# Patient Record
Sex: Female | Born: 1985 | Race: Black or African American | Hispanic: No | Marital: Single | Smoking: Former smoker
Health system: Southern US, Community
[De-identification: ages and names within clinical notes are randomized; demographics above are authoritative.]

## PROBLEM LIST (undated history)

## (undated) ENCOUNTER — Inpatient Hospital Stay (HOSPITAL_COMMUNITY): Payer: Self-pay

## (undated) DIAGNOSIS — B999 Unspecified infectious disease: Secondary | ICD-10-CM

## (undated) DIAGNOSIS — R87629 Unspecified abnormal cytological findings in specimens from vagina: Secondary | ICD-10-CM

## (undated) HISTORY — PX: LEEP: SHX91

---

## 2015-11-07 ENCOUNTER — Encounter: Payer: Self-pay | Admitting: General Practice

## 2015-11-07 ENCOUNTER — Ambulatory Visit (INDEPENDENT_AMBULATORY_CARE_PROVIDER_SITE_OTHER): Payer: Self-pay | Admitting: General Practice

## 2015-11-07 ENCOUNTER — Encounter: Payer: Self-pay | Admitting: Obstetrics & Gynecology

## 2015-11-07 DIAGNOSIS — Z3201 Encounter for pregnancy test, result positive: Secondary | ICD-10-CM

## 2015-11-07 LAB — POCT PREGNANCY, URINE: PREG TEST UR: POSITIVE — AB

## 2015-11-07 NOTE — Progress Notes (Signed)
Patient here today for pregnancy test. UPT positive. Patient reports first positive home pregnancy test on 11/04/15. Reports lmp 09/24/15. edd 07/01/15. 6133w2d today.

## 2015-12-24 NOTE — L&D Delivery Note (Signed)
Delivery Note At 2:17 AM a viable female was delivered via Vaginal, Spontaneous Delivery (Presentation: ; Occiput Anterior).  APGARs and weight pending, NICU team at delivery.   Placenta status: Intact, Spontaneous.  Cord: 3 vessels with the following complications:  None, placenta sent to pathology.  Anesthesia: Epidural  Episiotomy: None Lacerations: 1st degree Suture Repair: 3.0 vicryl rapide Est. Blood Loss (mL): 200  Mom to postpartum.  Baby to NICU.  Erica Salas D 05/21/2016, 2:30 AM

## 2016-02-16 DIAGNOSIS — O26879 Cervical shortening, unspecified trimester: Secondary | ICD-10-CM | POA: Diagnosis present

## 2016-02-16 NOTE — H&P (Signed)
Erica Salas is a 30 y.o. female G2P1001 at 28+6 with shortened cervical length.  On anatomy scan = 2.1, using vaginal progesterone - recheck 2 weeks = 1.5.  D/W pt shortened cervix and poss early/previable delivery.  D/W pt r/b/a of McDonald cerclage, questions answered.  Will proceed.    Maternal Medical History:  Fetal activity: Perceived fetal activity is normal.      OB History    No data available    G2P1001 G1 2007 - female, 6#7 G2 present  +abn pap - colpo, poss LEEP H/o Chl  PMH: HTN; seasonal allergies  No past surgical history on file. 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   HENT: Negative.   Eyes: Negative.   Respiratory: Negative.   Cardiovascular: Negative.   Gastrointestinal: Negative.   Genitourinary: Negative.   Musculoskeletal: Negative.   Skin: Negative.   Neurological: Negative.   Psychiatric/Behavioral: Negative.       There were no vitals taken for this visit. Maternal Exam:  Abdomen: Patient reports no abdominal tenderness. Fundal height is appropriate for gestation.    Introitus: Normal vulva. Normal vagina.    Physical Exam  Constitutional: She is oriented to person, place, and time. She appears well-developed and well-nourished.  HENT:  Head: Normocephalic and atraumatic.  Cardiovascular: Normal rate and regular rhythm.   Respiratory: Effort normal. No respiratory distress. She has no wheezes.  GI:  Soft. Bowel sounds are normal. She exhibits no distension. There is no tenderness.  Musculoskeletal: Normal range of motion.  Neurological: She is alert and oriented to person, place, and time.  Skin: Skin is warm and dry.  Psychiatric: She has a normal mood and affect. Her behavior is normal.    Prenatal labs: ABO, Rh:  O+ Antibody:  negative Rubella:  immune RPR:   NR HBsAg:   neg HIV:   neg GBS:   unknown  CF neg/Hgb 10.9/Plt 420/GC neg/Chl neg/Varicella immune/Hgb electro WNL/First Tri US WNL  [place] nl anat, post plac, female CL = 2.1 to 1.5  Assessment/Plan: 29yo G2P1001 at 18+ with shortened cervical length for McDonald cerclage, d/w pt r/b/a of procedure  Erica Salas, Erica Salas 02/16/2016, 9:47 PM

## 2016-02-17 ENCOUNTER — Encounter (HOSPITAL_COMMUNITY)
Admission: AD | Disposition: A | Discharge status: Home / Self Care | Payer: Self-pay | Source: Ambulatory Visit | Attending: Obstetrics and Gynecology

## 2016-02-17 ENCOUNTER — Inpatient Hospital Stay: Admit: 2016-02-17 | Payer: Self-pay | Admitting: Obstetrics and Gynecology

## 2016-02-17 ENCOUNTER — Inpatient Hospital Stay (HOSPITAL_COMMUNITY): Payer: Medicaid Other | Admitting: Anesthesiology

## 2016-02-17 ENCOUNTER — Inpatient Hospital Stay (HOSPITAL_COMMUNITY)
Admission: AD | Admit: 2016-02-17 | Discharge: 2016-02-17 | Disposition: A | Discharge status: Home / Self Care | Payer: Medicaid Other | Source: Ambulatory Visit | Attending: Obstetrics and Gynecology | Admitting: Obstetrics and Gynecology

## 2016-02-17 ENCOUNTER — Encounter (HOSPITAL_COMMUNITY): Payer: Self-pay | Admitting: *Deleted

## 2016-02-17 DIAGNOSIS — O26872 Cervical shortening, second trimester: Secondary | ICD-10-CM | POA: Insufficient documentation

## 2016-02-17 DIAGNOSIS — Z87891 Personal history of nicotine dependence: Secondary | ICD-10-CM | POA: Insufficient documentation

## 2016-02-17 DIAGNOSIS — Z3A20 20 weeks gestation of pregnancy: Secondary | ICD-10-CM | POA: Diagnosis not present

## 2016-02-17 HISTORY — PX: CERVICAL CERCLAGE: SHX1329

## 2016-02-17 HISTORY — DX: Unspecified infectious disease: B99.9

## 2016-02-17 HISTORY — DX: Unspecified abnormal cytological findings in specimens from vagina: R87.629

## 2016-02-17 LAB — CBC
HCT: 29.8 % — ABNORMAL LOW (ref 36.0–46.0)
Hemoglobin: 10.2 g/dL — ABNORMAL LOW (ref 12.0–15.0)
MCH: 28.9 pg (ref 26.0–34.0)
MCHC: 34.2 g/dL (ref 30.0–36.0)
MCV: 84.4 fL (ref 78.0–100.0)
PLATELETS: 326 10*3/uL (ref 150–400)
RBC: 3.53 MIL/uL — ABNORMAL LOW (ref 3.87–5.11)
RDW: 14 % (ref 11.5–15.5)
WBC: 11.2 10*3/uL — ABNORMAL HIGH (ref 4.0–10.5)

## 2016-02-17 SURGERY — CERCLAGE, CERVIX, VAGINAL APPROACH
Anesthesia: Spinal | Site: Vagina

## 2016-02-17 MED ORDER — BUPIVACAINE IN DEXTROSE 0.75-8.25 % IT SOLN
INTRATHECAL | Status: DC | PRN
  Administered 2016-02-17: 1 mL via INTRATHECAL

## 2016-02-17 MED ORDER — IBUPROFEN 600 MG PO TABS
ORAL_TABLET | ORAL | Status: AC
  Filled 2016-02-17: qty 1

## 2016-02-17 MED ORDER — IBUPROFEN 800 MG PO TABS: 800.0000 mg | ORAL_TABLET | Freq: Three times a day (TID) | ORAL | Status: AC

## 2016-02-17 MED ORDER — IBUPROFEN 600 MG PO TABS
600.0000 mg | ORAL_TABLET | Freq: Once | ORAL | Status: AC
  Administered 2016-02-17: 600 mg via ORAL

## 2016-02-17 MED ORDER — PROGESTERONE MICRONIZED 200 MG PO CAPS: 200.0000 mg | ORAL_CAPSULE | Freq: Every day | ORAL | Status: DC

## 2016-02-17 MED ORDER — LACTATED RINGERS IV SOLN
INTRAVENOUS | Status: DC
  Administered 2016-02-17 (×2): via INTRAVENOUS

## 2016-02-17 SURGICAL SUPPLY — 21 items
CANISTER SUCT 3000ML (MISCELLANEOUS) ×3 IMPLANT
CLOTH BEACON ORANGE TIMEOUT ST (SAFETY) ×3 IMPLANT
COUNTER NEEDLE 1200 MAGNETIC (NEEDLE) IMPLANT
GLOVE BIO SURGEON STRL SZ 6.5 (GLOVE) ×2 IMPLANT
GLOVE BIO SURGEONS STRL SZ 6.5 (GLOVE) ×1
GLOVE BIOGEL PI IND STRL 7.0 (GLOVE) ×1 IMPLANT
GLOVE BIOGEL PI INDICATOR 7.0 (GLOVE) ×2
GOWN STRL REUS W/TWL LRG LVL3 (GOWN DISPOSABLE) ×6 IMPLANT
NEEDLE MAYO CATGUT SZ4 (NEEDLE) ×3 IMPLANT
NS IRRIG 1000ML POUR BTL (IV SOLUTION) ×3 IMPLANT
PACK VAGINAL MINOR WOMEN LF (CUSTOM PROCEDURE TRAY) ×3 IMPLANT
PAD OB MATERNITY 4.3X12.25 (PERSONAL CARE ITEMS) ×3 IMPLANT
PAD PREP 24X48 CUFFED NSTRL (MISCELLANEOUS) ×3 IMPLANT
SUT PROLENE 1 CTX 30  8455H (SUTURE) ×4
SUT PROLENE 1 CTX 30 8455H (SUTURE) ×2 IMPLANT
TOWEL OR 17X24 6PK STRL BLUE (TOWEL DISPOSABLE) ×6 IMPLANT
TRAY FOLEY CATH SILVER 14FR (SET/KITS/TRAYS/PACK) ×3 IMPLANT
TUBING NON-CON 1/4 X 20 CONN (TUBING) ×2 IMPLANT
TUBING NON-CON 1/4 X 20' CONN (TUBING) ×1
WATER STERILE IRR 1000ML POUR (IV SOLUTION) ×3 IMPLANT
YANKAUER SUCT BULB TIP NO VENT (SUCTIONS) ×3 IMPLANT

## 2016-02-17 NOTE — Anesthesia Postprocedure Evaluation (Signed)
Anesthesia Post Note  Patient: Erica Salas  Procedure(s) Performed: Procedure(s) (LRB): CERCLAGE CERVICAL (N/A)  Patient location during evaluation: PACU Anesthesia Type: Spinal Level of consciousness: awake and alert Pain management: pain level controlled Vital Signs Assessment: post-procedure vital signs reviewed and stable Respiratory status: spontaneous breathing, nonlabored ventilation, respiratory function stable and patient connected to nasal cannula oxygen Cardiovascular status: blood pressure returned to baseline and stable Postop Assessment: no signs of nausea or vomiting and spinal receding Anesthetic complications: no    Last Vitals:  Filed Vitals:   02/17/16 1045 02/17/16 1100  BP: 125/79 124/75  Pulse: 75 82  Temp:    Resp: 18 18    Last Pain: There were no vitals filed for this visit.               Ikea Demicco JENNETTE

## 2016-02-17 NOTE — Anesthesia Preprocedure Evaluation (Signed)
Anesthesia Evaluation  Patient identified by MRN, date of birth, ID band Patient awake    Reviewed: Allergy & Precautions, NPO status , Patient's Chart, lab work & pertinent test results  History of Anesthesia Complications Negative for: history of anesthetic complications  Airway Mallampati: II  TM Distance: >3 FB Neck ROM: Full    Dental no notable dental hx. (+) Dental Advisory Given   Pulmonary former smoker,    Pulmonary exam normal breath sounds clear to auscultation       Cardiovascular negative cardio ROS Normal cardiovascular exam Rhythm:Regular Rate:Normal     Neuro/Psych negative neurological ROS  negative psych ROS   GI/Hepatic negative GI ROS, Neg liver ROS,   Endo/Other  negative endocrine ROS  Renal/GU negative Renal ROS  negative genitourinary   Musculoskeletal negative musculoskeletal ROS (+)   Abdominal   Peds negative pediatric ROS (+)  Hematology negative hematology ROS (+)   Anesthesia Other Findings   Reproductive/Obstetrics (+) Pregnancy 20 weeks                             Anesthesia Physical Anesthesia Plan  ASA: II  Anesthesia Plan: Spinal   Post-op Pain Management:    Induction:   Airway Management Planned:   Additional Equipment:   Intra-op Plan:   Post-operative Plan:   Informed Consent: I have reviewed the patients History and Physical, chart, labs and discussed the procedure including the risks, benefits and alternatives for the proposed anesthesia with the patient or authorized representative who has indicated his/her understanding and acceptance.   Dental advisory given  Plan Discussed with: CRNA  Anesthesia Plan Comments:         Anesthesia Quick Evaluation

## 2016-02-17 NOTE — Transfer of Care (Signed)
Immediate Anesthesia Transfer of Care Note  Patient: Erica Salas  Procedure(s) Performed: Procedure(s): CERCLAGE CERVICAL (N/A)  Patient Location: PACU  Anesthesia Type:Spinal  Level of Consciousness: awake, alert  and oriented  Airway & Oxygen Therapy: Patient Spontanous Breathing  Post-op Assessment: Report given to RN and Post -op Vital signs reviewed and stable  Post vital signs: Reviewed and stable  Last Vitals:  Filed Vitals:   02/17/16 0747  BP: 122/70  Pulse: 92  Temp: 36.8 C  Resp: 16    Complications: No apparent anesthesia complications

## 2016-02-17 NOTE — H&P (View-Only) (Signed)
Erica Salas is a 29 y.o. female G2P1001 at 20+6 with shortened cervical length.  On anatomy scan = 2.1, using vaginal progesterone - recheck 2 weeks = 1.5.  D/W pt shortened cervix and poss early/previable delivery.  D/W pt r/b/a of McDonald cerclage, questions answered.  Will proceed.    Maternal Medical History:  Fetal activity: Perceived fetal activity is normal.      OB History    No data available    G2P1001 G1 2007 - female, 6#7 G2 present  +abn pap - colpo, poss LEEP H/o Chl  PMH: HTN; seasonal allergies  No past surgical history on file. 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   HENT: Negative.   Eyes: Negative.   Respiratory: Negative.   Cardiovascular: Negative.   Gastrointestinal: Negative.   Genitourinary: Negative.   Musculoskeletal: Negative.   Skin: Negative.   Neurological: Negative.   Psychiatric/Behavioral: Negative.       There were no vitals taken for this visit. Maternal Exam:  Abdomen: Patient reports no abdominal tenderness. Fundal height is appropriate for gestation.    Introitus: Normal vulva. Normal vagina.    Physical Exam  Constitutional: She is oriented to person, place, and time. She appears well-developed and well-nourished.  HENT:  Head: Normocephalic and atraumatic.  Cardiovascular: Normal rate and regular rhythm.   Respiratory: Effort normal. No respiratory distress. She has no wheezes.  GI:  Soft. Bowel sounds are normal. She exhibits no distension. There is no tenderness.  Musculoskeletal: Normal range of motion.  Neurological: She is alert and oriented to person, place, and time.  Skin: Skin is warm and dry.  Psychiatric: She has a normal mood and affect. Her behavior is normal.    Prenatal labs: ABO, Rh:  O+ Antibody:  negative Rubella:  immune RPR:   NR HBsAg:   neg HIV:   neg GBS:   unknown  CF neg/Hgb 10.9/Plt 420/GC neg/Chl neg/Varicella immune/Hgb electro WNL/First Tri US WNL  US nl anat, post plac, female CL = 2.1 to 1.5  Assessment/Plan: 29yo G2P1001 at 20+ with shortened cervical length for McDonald cerclage, d/w pt r/b/a of procedure  Bovard-Stuckert, Thaniel Coluccio 02/16/2016, 9:47 PM     

## 2016-02-17 NOTE — Interval H&P Note (Signed)
History and Physical Interval Note:  02/17/2016 8:25 AM  Erica Salas  has presented today for surgery, with the diagnosis of short cervix  The various methods of treatment have been discussed with the patient and family. After consideration of risks, benefits and other options for treatment, the patient has consented to  Procedure(s): CERCLAGE CERVICAL (N/A) as a surgical intervention .  The patient's history has been reviewed, patient examined, no change in status, stable for surgery.  I have reviewed the patient's chart and labs.  Questions were answered to the patient's satisfaction.  Pt denies cramping or bleeding.     Bovard-Stuckert, Takeia Ciaravino

## 2016-02-17 NOTE — Anesthesia Procedure Notes (Signed)
Spinal Patient location during procedure: OR Staffing Anesthesiologist: Jazzman Loughmiller Performed by: anesthesiologist  Preanesthetic Checklist Completed: patient identified, site marked, surgical consent, pre-op evaluation, timeout performed, IV checked, risks and benefits discussed and monitors and equipment checked Spinal Block Patient position: sitting Prep: ChloraPrep Patient monitoring: continuous pulse ox, blood pressure and heart rate Approach: midline Injection technique: single-shot Needle Needle type: Sprotte  Needle gauge: 24 G Needle length: 9 cm Additional Notes Functioning IV was confirmed and monitors were applied. Sterile prep and drape, including hand hygiene, mask and sterile gloves were used. The patient was positioned and the spine was prepped. The skin was anesthetized with lidocaine.  Free flow of clear CSF was obtained prior to injecting local anesthetic into the CSF.  The spinal needle aspirated freely following injection.  The needle was carefully withdrawn.  The patient tolerated the procedure well. Consent was obtained prior to procedure with all questions answered and concerns addressed. Risks including but not limited to bleeding, infection, nerve damage, paralysis, failed block, inadequate analgesia, allergic reaction, high spinal, itching and headache were discussed and the patient wished to proceed.   Cecilie Heidel, MD     

## 2016-02-17 NOTE — Op Note (Signed)
Erica Salas, GABLE               ACCOUNT NO.:  [id number]  MEDICAL RECORD NO.:  [id number]  LOCATION:  [facility]                          FACILITY:  [facility]  PHYSICIAN:  Sherron Monday, MD        DATE OF BIRTH:  08-02-1986  DATE OF PROCEDURE:  02/17/2016 DATE OF DISCHARGE:                              OPERATIVE REPORT   PREOPERATIVE DIAGNOSIS:  Intrauterine pregnancy at 20+ 5 weeks.  Normal anatomy with shortened cervix from 2 cm to 1.5 cm when followed on ultrasound.  POSTOPERATIVE DIAGNOSIS:  Intrauterine pregnancy at 20+ 5 weeks.  Normal anatomy with shortened cervix from 2 cm to 1.5 cm when followed on ultrasound.  PROCEDURE:  McDonald cervical cerclage with 2 knots at 12 o'clock.  SURGEON:  Sherron Monday, MD.  ANESTHESIA:  Spinal.  IV FLUIDS:  1100 mL.  URINE OUTPUT:  150 mL clear urine at the end of procedure.  ESTIMATED BLOOD LOSS:  Approximately 5 mL.  COMPLICATIONS:  None.  PATHOLOGY:  None.  DESCRIPTION OF PROCEDURE:  After informed consent was reviewed with the patient and the father of the baby, she was transported to the OR, where spinal anesthesia was placed and she was returned to supine position. When this was found to be adequate, she was prepped and draped in the normal sterile fashion.  Her feet were placed in Yellofin stirrups and a Foley catheter was sterilely placed in her bladder.  Using an open-sided Graves speculum, her cervix was visualized and noted to be short, although on digital exam, it was felt to be almost 2 cm long as she had had a LEEP procedure.  Her cervix was difficult to differentiate from her vagina.  It was grasped with sponge sticks and these were used to help delineate the cervix.  Two sutures of 0 Monocryl were placed with bites from 12 to 10, 10 to 7, 7 to 4, 4 to 2 and 2 back to 12, these were both knotted.  They were placed at approximately the same level.  The initial suture was difficult to grasp the posterior lip and left side of  the cervix.  This was with a second suture was thought to be better.  The cervix was better held with the sutures.  At the end of the procedure, the cervix was closed and felt to be 1 cm long, and there was minimal bleeding.  The patient tolerated the procedure well. Sponge, lap, and needle count was correct x2 per the operating room staff.     Sherron Monday, MD     JB/MEDQ  D:  02/17/2016  T:  02/17/2016  Job:  [id number]

## 2016-02-17 NOTE — Brief Op Note (Signed)
02/17/2016  10:17 AM  PATIENT:  Erica Salas  30 y.o. female  PRE-OPERATIVE DIAGNOSIS:  short cervix  POST-OPERATIVE DIAGNOSIS:  short cervix  PROCEDURE:  Procedure(s): CERCLAGE CERVICAL (N/A)  SURGEON:  Surgeon(s) and Role:    * Timberlee Roblero Bovard-Stuckert, MD - Primary   ANESTHESIA:   spinal  EBL:  Total I/O In: 1100 [I.V.:1100] Out: 155 [Urine:150; Blood:5]  BLOOD ADMINISTERED:none  DRAINS: none   LOCAL MEDICATIONS USED:  NONE  SPECIMEN:  No Specimen  DISPOSITION OF SPECIMEN:  N/A  COUNTS:  YES  TOURNIQUET:  * No tourniquets in log *  DICTATION: .Other Dictation: Dictation Number [id number]  PLAN OF CARE: Discharge to home after PACU  PATIENT DISPOSITION:  PACU - hemodynamically stable.   Delay start of Pharmacological VTE agent (>24hrs) due to surgical blood loss or risk of bleeding: not applicable

## 2016-02-19 ENCOUNTER — Encounter (HOSPITAL_COMMUNITY): Payer: Self-pay | Admitting: Obstetrics and Gynecology

## 2016-03-03 ENCOUNTER — Inpatient Hospital Stay (HOSPITAL_COMMUNITY)
Admission: AD | Admit: 2016-03-03 | Discharge: 2016-03-03 | Disposition: A | Discharge status: Home / Self Care | Payer: Medicaid Other | Source: Ambulatory Visit | Attending: Obstetrics and Gynecology | Admitting: Obstetrics and Gynecology

## 2016-03-03 ENCOUNTER — Encounter (HOSPITAL_COMMUNITY): Payer: Self-pay

## 2016-03-03 DIAGNOSIS — O26892 Other specified pregnancy related conditions, second trimester: Secondary | ICD-10-CM | POA: Insufficient documentation

## 2016-03-03 DIAGNOSIS — Z87891 Personal history of nicotine dependence: Secondary | ICD-10-CM | POA: Insufficient documentation

## 2016-03-03 DIAGNOSIS — K921 Melena: Secondary | ICD-10-CM | POA: Diagnosis not present

## 2016-03-03 DIAGNOSIS — O99612 Diseases of the digestive system complicating pregnancy, second trimester: Secondary | ICD-10-CM | POA: Diagnosis not present

## 2016-03-03 DIAGNOSIS — Z3A23 23 weeks gestation of pregnancy: Secondary | ICD-10-CM | POA: Insufficient documentation

## 2016-03-03 DIAGNOSIS — O9989 Other specified diseases and conditions complicating pregnancy, childbirth and the puerperium: Secondary | ICD-10-CM

## 2016-03-03 DIAGNOSIS — O99891 Other specified diseases and conditions complicating pregnancy: Secondary | ICD-10-CM

## 2016-03-03 LAB — URINALYSIS, ROUTINE W REFLEX MICROSCOPIC
Bilirubin Urine: NEGATIVE
GLUCOSE, UA: NEGATIVE mg/dL
Ketones, ur: NEGATIVE mg/dL
Nitrite: NEGATIVE
PH: 6 (ref 5.0–8.0)
PROTEIN: NEGATIVE mg/dL
Specific Gravity, Urine: 1.02 (ref 1.005–1.030)

## 2016-03-03 LAB — CBC
HEMATOCRIT: 31.1 % — AB (ref 36.0–46.0)
HEMOGLOBIN: 10.5 g/dL — AB (ref 12.0–15.0)
MCH: 28.9 pg (ref 26.0–34.0)
MCHC: 33.8 g/dL (ref 30.0–36.0)
MCV: 85.7 fL (ref 78.0–100.0)
Platelets: 311 10*3/uL (ref 150–400)
RBC: 3.63 MIL/uL — ABNORMAL LOW (ref 3.87–5.11)
RDW: 14.3 % (ref 11.5–15.5)
WBC: 12.3 10*3/uL — ABNORMAL HIGH (ref 4.0–10.5)

## 2016-03-03 LAB — URINE MICROSCOPIC-ADD ON

## 2016-03-03 NOTE — Discharge Instructions (Signed)
Anal Fissure, Adult °An anal fissure is a small tear or crack in the skin around the anus. Bleeding from a fissure usually stops on its own within a few minutes. However, bleeding will often occur again with each bowel movement until the crack heals. °CAUSES °This condition may be caused by: °· Passing large, hard stool (feces). °· Frequent diarrhea. °· Constipation. °· Inflammatory bowel disease (Crohn disease or ulcerative colitis). °· Infections. °· Anal sex. °SYMPTOMS °Symptoms of this condition include: °· Bleeding from the rectum. °· Small amounts of blood seen on your stool, on toilet paper, or in the toilet after a bowel movement. °· Painful bowel movements. °· Itching or irritation around the anus. °DIAGNOSIS  °A health care provider may diagnose this condition by closely examining the anal area. An anal fissure can usually be seen with careful inspection. In some cases, a rectal exam may be performed, or a short tube (anoscope) may be used to examine the anal canal. °TREATMENT °Treatment for this condition may include: °· Taking steps to avoid constipation. This may include making changes to your diet, such as increasing your intake of fiber or fluid. °· Taking fiber supplements. These supplements can soften your stool to help make bowel movements easier. Your health care provider may also prescribe a stool softener if your stool is often hard. °· Taking sitz baths. This may help to heal the tear. °· Using medicated creams or ointments. These may be prescribed to lessen discomfort. °HOME CARE INSTRUCTIONS °Eating and Drinking °· Avoid foods that may be constipating, such as bananas and dairy products. °· Drink enough fluid to keep your urine clear or pale yellow. °· Maintain a diet that is high in fruits, whole grains, and vegetables. °General Instructions °· Keep the anal area as clean and dry as possible. °· Take sitz baths as told by your health care provider. Do not use soap in the sitz baths. °· Take  over-the-counter and prescription medicines only as told by your health care provider. °· Use creams or ointments only as told by your health care provider. °· Keep all follow-up visits as told by your health care provider. This is important. °SEEK MEDICAL CARE IF: °· You have more bleeding. °· You have a fever. °· You have diarrhea that is mixed with blood. °· You continue to have pain. °· Your problem is getting worse rather than better. °  °This information is not intended to replace advice given to you by your health care provider. Make sure you discuss any questions you have with your health care provider. °  °Document Released: 12/09/2005 Document Revised: 08/30/2015 Document Reviewed: 03/06/2015 °Elsevier Interactive Patient Education ©2016 [employer] ° °

## 2016-03-03 NOTE — MAU Provider Note (Signed)
History     CSN: [id number]  Arrival date and time: 03/03/16 [id number]   First Provider Initiated Contact with Patient 03/03/16 [phone]         Chief Complaint  Patient presents with  . Blood In Stools   HPI Comments: Erica Salas is a 30 y.o. G2P1002 at 5514w0d who presents for rectal bleeding. Had BM (passed soft stool, no straining) at 6 am this morning. Noticed bright red blood on toilet paper & bright red blood in stool. Denies abdominal or rectal pain, denies hemorrhoids. Does not think any of the blood was vaginal.   Rectal Bleeding  The current episode started today. The problem has been resolved. The patient is experiencing no pain. The stool is described as soft. Pertinent negatives include no fever, no abdominal pain, no diarrhea, no hemorrhoids, no rectal pain, no vomiting, no hematuria and no vaginal bleeding. She has been behaving normally. She has been eating and drinking normally. Her past medical history does not include abdominal surgery, inflammatory bowel disease, recent abdominal injury, recent change in diet or a recent illness. There were no sick contacts.    OB History    Gravida Para Term Preterm AB TAB SAB Ectopic Multiple Living   2 1 1  0 0 0 0 0 0 2      Past Medical History  Diagnosis Date  . Vaginal Pap smear, abnormal   . Infection     UTI    Past Surgical History  Procedure Laterality Date  . Leep    . Cervical cerclage N/A 02/17/2016    Procedure: CERCLAGE CERVICAL;  Surgeon: [name], MD;  Location: [facility];  Service: Gynecology;  Laterality: N/A;    Family History  Problem Relation Age of Onset  . Diabetes Mother   . Heart disease Mother     hx of MI  . Kidney disease Father   . Cancer Maternal Aunt     one died w/colon/liver; one died from lung    Social History  Substance Use Topics  . Smoking status: Former Smoker    Types: Cigarettes  . Smokeless tobacco: Never Used     Comment: quit Oct 2016  . Alcohol Use: No     Allergies: No Known Allergies  Prescriptions prior to admission  Medication Sig Dispense Refill Last Dose  . Prenatal Vit-Fe Fumarate-FA (PRENATAL MULTIVITAMIN) TABS tablet Take 1 tablet by mouth daily at 12 noon.   02/17/2016 at Unknown time  . progesterone (PROMETRIUM) 200 MG capsule Place 1 capsule (200 mg total) vaginally at bedtime. 30 capsule 5     Review of Systems  Constitutional: Negative.  Negative for fever.  Gastrointestinal: Positive for blood in stool and hematochezia. Negative for vomiting, abdominal pain, diarrhea, melena, rectal pain and hemorrhoids.  Genitourinary: Negative.  Negative for hematuria and vaginal bleeding.   Physical Exam   Blood pressure 139/79, pulse 93, temperature 98 F (36.7 C), resp. rate 18, height 5' 3.5" (1.613 m), weight 170 lb 9.6 oz (77.384 kg).  Physical Exam  Nursing note and vitals reviewed. Constitutional: She is oriented to person, place, and time. She appears well-developed and well-nourished. No distress.  HENT:  Head: Normocephalic and atraumatic.  Eyes: Conjunctivae are normal. Right eye exhibits no discharge. Left eye exhibits no discharge. No scleral icterus.  Neck: Normal range of motion.  Cardiovascular: Normal rate.   Respiratory: Effort normal. No respiratory distress.  GI: Soft. Bowel sounds are normal. There is no tenderness.  Genitourinary: Vagina normal.  Rectal exam shows fissure. Rectal exam shows no external hemorrhoid and no tenderness. No bleeding in the vagina.  Cervix visually closed. Cerclage visualized.  Miniscule amount of blood on digital rectal exam.   Neurological: She is alert and oriented to person, place, and time.  Skin: Skin is warm and dry. She is not diaphoretic.  Psychiatric: She has a normal mood and affect. Her behavior is normal. Judgment and thought content normal.    MAU Course  Procedures  MDM CBC FHT appropriate for gestation, no contractions Cervix visually closed & no blood noted  during SSE Small anal fissure. Minimal amount of blood on DRE  Care turned over to [facility]. CBC pending.     Judeth Horn, NP 03/03/2016 8:05 AM   Care of pt assumed by Dorathy Kinsman, CNM,   CBC Latest Ref Rng 03/03/2016 02/17/2016  WBC 4.0 - 10.5 K/uL 12.3(H) 11.2(H)  Hemoglobin 12.0 - 15.0 g/dL 10.5(L) 10.2(L)  Hematocrit 36.0 - 46.0 % 31.1(L) 29.8(L)  Platelets 150 - 400 K/uL 311 326   Hgb stable. Doubt GI bleed.   Assessment and Plan   1. Hematochezia   2. Current maternal conditions classifiable elsewhere, antepartum    Plan:  D/C home in stable condition per consult w/ Dr. Jackelyn Knife. Increase fluids and fiber. PLT precautions.  Follow-up Information    Follow up with [facility].   Why:  Routine prenatal visit or sooner as needed if symptoms worsen   Contact information:   [address]  [address] [place] [address] [phone]       Follow up with [facility] [place] [facility] AD[facility].   Why:  As needed in emergencies   Contact information:   [address] [id number] mc [place] [address] [phone]        Medication List    ASK your doctor about these medications        metroNIDAZOLE 250 MG tablet  Commonly known as:  FLAGYL  Take 250 mg by mouth 2 (two) times daily.     prenatal multivitamin Tabs tablet  Take 1 tablet by mouth daily at 12 noon.     progesterone 200 MG capsule  Commonly known as:  PROMETRIUM  Place 1 capsule (200 mg total) vaginally at bedtime.        [place]ier, CNM 03/03/2016 9:14 AM

## 2016-03-03 NOTE — MAU Note (Addendum)
I had BM about 0615 and it had a lot of blood in it. No pain. No hx hemorrhoids. I am sure it was not vaginal bleeding. Has cerclage

## 2016-03-14 ENCOUNTER — Encounter (HOSPITAL_COMMUNITY): Payer: Medicaid Other

## 2016-03-14 ENCOUNTER — Ambulatory Visit (HOSPITAL_COMMUNITY): Payer: Medicaid Other

## 2016-03-20 ENCOUNTER — Other Ambulatory Visit (HOSPITAL_COMMUNITY): Payer: Self-pay | Admitting: Obstetrics and Gynecology

## 2016-03-20 DIAGNOSIS — O359XX0 Maternal care for (suspected) fetal abnormality and damage, unspecified, not applicable or unspecified: Secondary | ICD-10-CM

## 2016-03-21 ENCOUNTER — Encounter (HOSPITAL_COMMUNITY): Payer: Self-pay

## 2016-03-21 ENCOUNTER — Ambulatory Visit (HOSPITAL_COMMUNITY): Admission: RE | Admit: 2016-03-21 | Payer: Medicaid Other | Source: Ambulatory Visit

## 2016-03-21 ENCOUNTER — Other Ambulatory Visit (HOSPITAL_COMMUNITY): Payer: Self-pay | Admitting: Obstetrics and Gynecology

## 2016-03-21 ENCOUNTER — Ambulatory Visit (HOSPITAL_COMMUNITY)
Admission: RE | Admit: 2016-03-21 | Discharge: 2016-03-21 | Disposition: A | Discharge status: Home / Self Care | Payer: Medicaid Other | Source: Ambulatory Visit | Attending: Obstetrics and Gynecology | Admitting: Obstetrics and Gynecology

## 2016-03-21 DIAGNOSIS — Z3A25 25 weeks gestation of pregnancy: Secondary | ICD-10-CM | POA: Diagnosis not present

## 2016-03-21 DIAGNOSIS — O3432 Maternal care for cervical incompetence, second trimester: Secondary | ICD-10-CM | POA: Insufficient documentation

## 2016-03-21 DIAGNOSIS — Z36 Encounter for antenatal screening of mother: Secondary | ICD-10-CM | POA: Insufficient documentation

## 2016-03-21 DIAGNOSIS — O162 Unspecified maternal hypertension, second trimester: Secondary | ICD-10-CM | POA: Diagnosis not present

## 2016-03-21 DIAGNOSIS — O10912 Unspecified pre-existing hypertension complicating pregnancy, second trimester: Secondary | ICD-10-CM

## 2016-03-21 DIAGNOSIS — O283 Abnormal ultrasonic finding on antenatal screening of mother: Secondary | ICD-10-CM

## 2016-03-21 DIAGNOSIS — O359XX0 Maternal care for (suspected) fetal abnormality and damage, unspecified, not applicable or unspecified: Secondary | ICD-10-CM

## 2016-03-22 ENCOUNTER — Other Ambulatory Visit (HOSPITAL_COMMUNITY): Payer: Self-pay | Admitting: *Deleted

## 2016-03-22 DIAGNOSIS — IMO0001 Reserved for inherently not codable concepts without codable children: Secondary | ICD-10-CM

## 2016-03-22 DIAGNOSIS — O358XX0 Maternal care for other (suspected) fetal abnormality and damage, not applicable or unspecified: Principal | ICD-10-CM

## 2016-03-27 ENCOUNTER — Encounter (HOSPITAL_COMMUNITY): Payer: Self-pay

## 2016-03-27 ENCOUNTER — Other Ambulatory Visit (HOSPITAL_COMMUNITY): Payer: Self-pay

## 2016-04-18 ENCOUNTER — Encounter (HOSPITAL_COMMUNITY): Payer: Self-pay | Admitting: *Deleted

## 2016-04-18 ENCOUNTER — Observation Stay (HOSPITAL_COMMUNITY)
Admission: AD | Admit: 2016-04-18 | Discharge: 2016-04-19 | Disposition: A | Discharge status: Home / Self Care | Payer: Medicaid Other | Source: Ambulatory Visit | Attending: Obstetrics and Gynecology | Admitting: Obstetrics and Gynecology

## 2016-04-18 DIAGNOSIS — O9A213 Injury, poisoning and certain other consequences of external causes complicating pregnancy, third trimester: Secondary | ICD-10-CM | POA: Diagnosis not present

## 2016-04-18 DIAGNOSIS — S3991XA Unspecified injury of abdomen, initial encounter: Secondary | ICD-10-CM | POA: Diagnosis not present

## 2016-04-18 DIAGNOSIS — Y9389 Activity, other specified: Secondary | ICD-10-CM | POA: Diagnosis not present

## 2016-04-18 DIAGNOSIS — Y9289 Other specified places as the place of occurrence of the external cause: Secondary | ICD-10-CM | POA: Diagnosis not present

## 2016-04-18 DIAGNOSIS — Y990 Civilian activity done for income or pay: Secondary | ICD-10-CM | POA: Insufficient documentation

## 2016-04-18 DIAGNOSIS — O9A219 Injury, poisoning and certain other consequences of external causes complicating pregnancy, unspecified trimester: Secondary | ICD-10-CM

## 2016-04-18 DIAGNOSIS — Z87440 Personal history of urinary (tract) infections: Secondary | ICD-10-CM | POA: Insufficient documentation

## 2016-04-18 DIAGNOSIS — W51XXXA Accidental striking against or bumped into by another person, initial encounter: Secondary | ICD-10-CM

## 2016-04-18 DIAGNOSIS — Z3A29 29 weeks gestation of pregnancy: Secondary | ICD-10-CM

## 2016-04-18 DIAGNOSIS — Z87891 Personal history of nicotine dependence: Secondary | ICD-10-CM | POA: Insufficient documentation

## 2016-04-18 LAB — OB RESULTS CONSOLE HIV ANTIBODY (ROUTINE TESTING): HIV: NONREACTIVE

## 2016-04-18 LAB — ABO/RH: ABO/RH(D): O POS

## 2016-04-18 LAB — OB RESULTS CONSOLE RPR: RPR: NONREACTIVE

## 2016-04-18 LAB — CBC
HCT: 31.2 % — ABNORMAL LOW (ref 36.0–46.0)
HEMOGLOBIN: 10.7 g/dL — AB (ref 12.0–15.0)
MCH: 29 pg (ref 26.0–34.0)
MCHC: 34.3 g/dL (ref 30.0–36.0)
MCV: 84.6 fL (ref 78.0–100.0)
Platelets: 316 10*3/uL (ref 150–400)
RBC: 3.69 MIL/uL — ABNORMAL LOW (ref 3.87–5.11)
RDW: 13.8 % (ref 11.5–15.5)
WBC: 15.3 10*3/uL — ABNORMAL HIGH (ref 4.0–10.5)

## 2016-04-18 LAB — TYPE AND SCREEN
ABO/RH(D): O POS
ANTIBODY SCREEN: NEGATIVE

## 2016-04-18 LAB — OB RESULTS CONSOLE HEPATITIS B SURFACE ANTIGEN: Hepatitis B Surface Ag: NEGATIVE

## 2016-04-18 LAB — OB RESULTS CONSOLE RUBELLA ANTIBODY, IGM: RUBELLA: IMMUNE

## 2016-04-18 MED ORDER — DOCUSATE SODIUM 100 MG PO CAPS: 100.0000 mg | ORAL_CAPSULE | Freq: Every day | ORAL | Status: DC

## 2016-04-18 MED ORDER — ZOLPIDEM TARTRATE 5 MG PO TABS: 5.0000 mg | ORAL_TABLET | Freq: Every evening | ORAL | Status: DC | PRN

## 2016-04-18 MED ORDER — ACETAMINOPHEN 325 MG PO TABS
650.0000 mg | ORAL_TABLET | ORAL | Status: DC | PRN
  Administered 2016-04-19: 650 mg via ORAL
  Filled 2016-04-18: qty 2

## 2016-04-18 MED ORDER — CALCIUM CARBONATE ANTACID 500 MG PO CHEW: 2.0000 | CHEWABLE_TABLET | ORAL | Status: DC | PRN

## 2016-04-18 MED ORDER — PRENATAL MULTIVITAMIN CH: 1.0000 | ORAL_TABLET | Freq: Every day | ORAL | Status: DC

## 2016-04-18 MED ORDER — LACTATED RINGERS IV SOLN
INTRAVENOUS | Status: DC
  Administered 2016-04-18 (×2): via INTRAVENOUS

## 2016-04-18 MED ORDER — PRENATAL MULTIVITAMIN CH
1.0000 | ORAL_TABLET | Freq: Every day | ORAL | Status: DC
  Filled 2016-04-18: qty 1

## 2016-04-18 MED ORDER — ACETAMINOPHEN 325 MG PO TABS: 650.0000 mg | ORAL_TABLET | ORAL | Status: DC | PRN
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
## 2016-04-18 NOTE — MAU Note (Signed)
Urine sent to lab 
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
## 2016-04-19 NOTE — Discharge Instructions (Signed)
Preterm Labor Information °Preterm labor is when labor starts before you are [redacted] weeks pregnant. The normal length of pregnancy is 39 to 41 weeks.  °CAUSES  °The cause of preterm labor is not often known. The most common known cause is infection. °RISK FACTORS °· Having a history of preterm labor. °· Having your water break before it should. °· Having a placenta that covers the opening of the cervix. °· Having a placenta that breaks away from the uterus. °· Having a cervix that is too weak to hold the baby in the uterus. °· Having too much fluid in the amniotic sac. °· Taking drugs or smoking while pregnant. °· Not gaining enough weight while pregnant. °· Being younger than 18 and older than 29 years old. °· Having a low income. °· Being African American. °SYMPTOMS °· Period-like cramps, belly (abdominal) pain, or back pain. °· Contractions that are regular, as often as six in an hour. They may be mild or painful. °· Contractions that start at the top of the belly. They then move to the lower belly and back. °· Lower belly pressure that seems to get stronger. °· Bleeding from the vagina. °· Fluid leaking from the vagina. °TREATMENT  °Treatment depends on: °· Your condition. °· The condition of your baby. °· How many weeks pregnant you are. °Your doctor may have you: °· Take medicine to stop contractions. °· Stay in bed except to use the restroom (bed rest). °· Stay in the hospital. °WHAT SHOULD YOU DO IF YOU THINK YOU ARE IN PRETERM LABOR? °Call your doctor right away. You need to go to the hospital right away.  °HOW CAN YOU PREVENT PRETERM LABOR IN FUTURE PREGNANCIES? °· Stop smoking, if you smoke. °· Maintain healthy weight gain. °· Do not take drugs or be around chemicals that are not needed. °· Tell your doctor if you think you have an infection. °· Tell your doctor if you had a preterm labor before. °  °This information is not intended to replace advice given to you by your health care provider. Make sure you  discuss any questions you have with your health care provider. °  °Document Released: 03/07/2009 Document Revised: 04/25/2015 Document Reviewed: 01/11/2013 °Elsevier Interactive Patient Education ©2016 Elsevier Inc. ° °
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
## 2016-04-19 NOTE — Progress Notes (Signed)
Patient ready for discharge.  She was given tylenol for a headache, the cafeteria did not bring her breakfast.  Instructions given to patient.  She will follow up with her doctor as scheduled.
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
## 2016-05-02 ENCOUNTER — Encounter (HOSPITAL_COMMUNITY): Payer: Self-pay

## 2016-05-02 ENCOUNTER — Ambulatory Visit (HOSPITAL_COMMUNITY)
Admission: RE | Admit: 2016-05-02 | Discharge: 2016-05-02 | Disposition: A | Discharge status: Home / Self Care | Payer: Medicaid Other | Source: Ambulatory Visit | Attending: Obstetrics and Gynecology | Admitting: Obstetrics and Gynecology

## 2016-05-02 DIAGNOSIS — O358XX0 Maternal care for other (suspected) fetal abnormality and damage, not applicable or unspecified: Secondary | ICD-10-CM | POA: Diagnosis present

## 2016-05-02 DIAGNOSIS — Z3A31 31 weeks gestation of pregnancy: Secondary | ICD-10-CM | POA: Diagnosis not present

## 2016-05-02 DIAGNOSIS — IMO0001 Reserved for inherently not codable concepts without codable children: Secondary | ICD-10-CM

## 2016-05-06 ENCOUNTER — Encounter (HOSPITAL_COMMUNITY): Payer: Self-pay

## 2016-05-06 ENCOUNTER — Inpatient Hospital Stay (HOSPITAL_COMMUNITY)
Admission: AD | Admit: 2016-05-06 | Discharge: 2016-05-23 | DRG: 775 | Disposition: A | Discharge status: Home / Self Care | Payer: Medicaid Other | Source: Ambulatory Visit | Attending: Obstetrics and Gynecology | Admitting: Obstetrics and Gynecology

## 2016-05-06 DIAGNOSIS — Z87891 Personal history of nicotine dependence: Secondary | ICD-10-CM

## 2016-05-06 DIAGNOSIS — O42113 Preterm premature rupture of membranes, onset of labor more than 24 hours following rupture, third trimester: Principal | ICD-10-CM | POA: Diagnosis present

## 2016-05-06 DIAGNOSIS — O26873 Cervical shortening, third trimester: Secondary | ICD-10-CM | POA: Diagnosis present

## 2016-05-06 DIAGNOSIS — Z3A34 34 weeks gestation of pregnancy: Secondary | ICD-10-CM

## 2016-05-06 DIAGNOSIS — O09219 Supervision of pregnancy with history of pre-term labor, unspecified trimester: Secondary | ICD-10-CM

## 2016-05-06 DIAGNOSIS — O42913 Preterm premature rupture of membranes, unspecified as to length of time between rupture and onset of labor, third trimester: Secondary | ICD-10-CM | POA: Diagnosis present

## 2016-05-06 DIAGNOSIS — O3433 Maternal care for cervical incompetence, third trimester: Secondary | ICD-10-CM | POA: Diagnosis present

## 2016-05-06 DIAGNOSIS — O368930 Maternal care for other specified fetal problems, third trimester, not applicable or unspecified: Secondary | ICD-10-CM | POA: Diagnosis present

## 2016-05-06 LAB — OB RESULTS CONSOLE GBS: GBS: NEGATIVE

## 2016-05-06 LAB — URINE MICROSCOPIC-ADD ON

## 2016-05-06 LAB — URINALYSIS, ROUTINE W REFLEX MICROSCOPIC
BILIRUBIN URINE: NEGATIVE
Glucose, UA: 250 mg/dL — AB
KETONES UR: NEGATIVE mg/dL
NITRITE: NEGATIVE
PH: 6.5 (ref 5.0–8.0)
Protein, ur: 30 mg/dL — AB
SPECIFIC GRAVITY, URINE: 1.02 (ref 1.005–1.030)

## 2016-05-06 LAB — TYPE AND SCREEN
ABO/RH(D): O POS
ANTIBODY SCREEN: NEGATIVE

## 2016-05-06 LAB — CBC
HCT: 30.4 % — ABNORMAL LOW (ref 36.0–46.0)
HEMOGLOBIN: 10.2 g/dL — AB (ref 12.0–15.0)
MCH: 28.6 pg (ref 26.0–34.0)
MCHC: 33.6 g/dL (ref 30.0–36.0)
MCV: 85.2 fL (ref 78.0–100.0)
PLATELETS: 338 10*3/uL (ref 150–400)
RBC: 3.57 MIL/uL — AB (ref 3.87–5.11)
RDW: 13.8 % (ref 11.5–15.5)
WBC: 14.5 10*3/uL — AB (ref 4.0–10.5)

## 2016-05-06 MED ORDER — LACTATED RINGERS IV SOLN
INTRAVENOUS | Status: DC
  Administered 2016-05-06 – 2016-05-21 (×7): via INTRAVENOUS

## 2016-05-06 MED ORDER — ACETAMINOPHEN 325 MG PO TABS
650.0000 mg | ORAL_TABLET | ORAL | Status: DC | PRN
  Administered 2016-05-08 – 2016-05-20 (×5): 650 mg via ORAL
  Filled 2016-05-06 (×4): qty 2

## 2016-05-06 MED ORDER — BETAMETHASONE SOD PHOS & ACET 6 (3-3) MG/ML IJ SUSP
12.0000 mg | INTRAMUSCULAR | Status: AC
  Administered 2016-05-06 – 2016-05-07 (×2): 12 mg via INTRAMUSCULAR
  Filled 2016-05-06 (×2): qty 2

## 2016-05-06 MED ORDER — DOCUSATE SODIUM 100 MG PO CAPS
100.0000 mg | ORAL_CAPSULE | Freq: Every day | ORAL | Status: DC
  Administered 2016-05-07 – 2016-05-20 (×14): 100 mg via ORAL
  Filled 2016-05-06 (×15): qty 1

## 2016-05-06 MED ORDER — PROGESTERONE MICRONIZED 200 MG PO CAPS
200.0000 mg | ORAL_CAPSULE | Freq: Every day | ORAL | Status: DC
  Administered 2016-05-06: 200 mg via VAGINAL
  Filled 2016-05-06: qty 1

## 2016-05-06 MED ORDER — ZOLPIDEM TARTRATE 5 MG PO TABS: 5.0000 mg | ORAL_TABLET | Freq: Every evening | ORAL | Status: DC | PRN

## 2016-05-06 MED ORDER — PRENATAL MULTIVITAMIN CH
1.0000 | ORAL_TABLET | Freq: Every day | ORAL | Status: DC
  Administered 2016-05-07 – 2016-05-20 (×14): 1 via ORAL
  Filled 2016-05-06 (×16): qty 1

## 2016-05-06 MED ORDER — CALCIUM CARBONATE ANTACID 500 MG PO CHEW
2.0000 | CHEWABLE_TABLET | ORAL | Status: DC | PRN
  Administered 2016-05-10: 400 mg via ORAL
  Filled 2016-05-06: qty 2

## 2016-05-06 NOTE — MAU Note (Signed)
Pt c/o pelvic and vaginal pain after chasing dog today. Denies vaginal bleeding but has some discharge. Denies contractions. +FM

## 2016-05-06 NOTE — H&P (Signed)
Erica Salas is a 30 y.o. female  G2P1001 at 32+ with advanced cervical dilation and cerclage under tension.  No VB.  Pt c/o pelvic pressure.  Cerclage placed at 20 wk, given shortened cervix 2 knots at 12 o'clock - [place].  Also receiving vaginal progesterone.  Fetus with cystic dysplastic kidney, has had MFM eval.  +FM, no LOF, no VB, run of ctx this evening after chasing her dog.  Increased pressure.  SVE 2cm dilated - cerclage tight, no bleeding.     Maternal Medical History:  Contractions: Frequency: irregular.    Fetal activity: Perceived fetal activity is normal.    Prenatal complications: Preterm labor.   Prenatal Complications - Diabetes: none.    OB History    Gravida Para Term Preterm AB TAB SAB Ectopic Multiple Living   2 1 1  0 0 0 0 0 0 1    G1 6#7 SVD female 03/2006 G2 present  H/o abn pap No STD  Past Medical History  Diagnosis Date  . Vaginal Pap smear, abnormal   . Infection     UTI  ?CHTN Environmental allergies  Past Surgical History  Procedure Laterality Date  . Leep    . Cervical cerclage N/A 02/17/2016    Procedure: CERCLAGE CERVICAL;  Surgeon: [name], MD;  Location: [facility];  Service: Gynecology;  Laterality: N/A;   Family History: family history includes Cancer in her maternal aunt; Diabetes in her maternal aunt and mother; Heart disease in her mother; Kidney disease in her father. Social History:  reports that she quit smoking about 7 months ago. Her smoking use included Cigarettes. She has never used smokeless tobacco. She reports that she does not drink alcohol or use illicit drugs.  Meds PNV, Progesterone All Strawberry, NKDA   Prenatal Transfer Tool  Maternal Diabetes: No Genetic Screening: Normal Maternal Ultrasounds/Referrals: Abnormal:  Findings:   Fetal Kidney Anomalies Fetal Ultrasounds or other Referrals:  Referred to Materal Fetal Medicine  Maternal Substance Abuse:  Yes:  Type: Smoker Significant Maternal Medications:   None Significant Maternal Lab Results:  None Other Comments:  shortened cervix - cerclage placed  Review of Systems  Constitutional: Negative.   HENT: Negative.   Eyes: Negative.   Respiratory: Negative.   Cardiovascular: Negative.   Gastrointestinal: Negative.   Genitourinary:       Pelvic pressure  Musculoskeletal: Negative.   Skin: Negative.   Neurological: Negative.   Psychiatric/Behavioral: Negative.     Dilation: 2 Effacement (%): 80 Exam by:: D. Hart RochesterLawson CNM Blood pressure 124/69, pulse 100, temperature 98.6 F (37 C), temperature source Oral, resp. rate 18, height 5\' 4"  (1.626 m), weight 81.194 kg (179 lb), last menstrual period 09/24/2015, SpO2 100 %. Maternal Exam:  Uterine Assessment: Contraction frequency is rare.   Abdomen: Patient reports no abdominal tenderness. Fundal height is appropriate for gestation.   Estimated fetal weight is 4#.   Fetal presentation: vertex  Introitus: Normal vulva. Normal vagina.  Cervix: Cervix evaluated by digital exam.     Physical Exam  Constitutional: She is oriented to person, place, and time. She appears well-developed and well-nourished.  HENT:  Head: Normocephalic and atraumatic.  Cardiovascular: Normal rate and regular rhythm.   Respiratory: Effort normal and breath sounds normal. No respiratory distress. She has no wheezes.  GI: Soft. Bowel sounds are normal. She exhibits no distension. There is no tenderness.  Musculoskeletal: Normal range of motion.  Neurological: She is alert and oriented to person, place, and time.  Skin: Skin  is warm and dry.  Psychiatric: She has a normal mood and affect. Her behavior is normal.    Prenatal labs: ABO, Rh: --/--/O POS, O POS (04/27 1418) Antibody: NEG (04/27 1418) Rubella:  immune  RPR:   NR HBsAg:   neg HIV:   neg GBS:   unknown  Hgb 10.9/glucola 178 - nl 3hr GTT/Plt 420K/Ur Cx positive - neg TOC/ GC neg/ Chl neg/ Varicella immune/ Hgb electro WNL/CF neg/  Nl NT Dated by  LMP cw first tri [place] Nl anat, short cervix, post plac, female    Assessment/Plan: 29yo G2P1001 at 32+ with adv dilation, cerclage on tension Admit for BMZ, monitoring Bed rest/BR privileges Mg prn  Bovard-Stuckert, Erica Salas 05/06/2016, 9:01 PM

## 2016-05-06 NOTE — MAU Provider Note (Signed)
History   G2P1001 @ 32.1 wks in with pain in vagina after chasing her dog this afternoon. Pt had cervical stitch in place.  CSN: [id number]  Arrival date & time 05/06/16  1942   None     Chief Complaint  Patient presents with  . Vaginal Pain    HPI  Past Medical History  Diagnosis Date  . Vaginal Pap smear, abnormal   . Infection     UTI    Past Surgical History  Procedure Laterality Date  . Leep    . Cervical cerclage N/A 02/17/2016    Procedure: CERCLAGE CERVICAL;  Surgeon: [name], MD;  Location: [facility];  Service: Gynecology;  Laterality: N/A;    Family History  Problem Relation Age of Onset  . Diabetes Mother   . Heart disease Mother     hx of MI  . Kidney disease Father   . Cancer Maternal Aunt     one died w/colon/liver; one died from lung  . Diabetes Maternal Aunt     Social History  Substance Use Topics  . Smoking status: Former Smoker    Types: Cigarettes    Quit date: 09/23/2015  . Smokeless tobacco: Never Used     Comment: quit Oct 2016  . Alcohol Use: No    OB History    Gravida Para Term Preterm AB TAB SAB Ectopic Multiple Living   2 1 1  0 0 0 0 0 0 1      Review of Systems  Constitutional: Negative.   HENT: Negative.   Eyes: Negative.   Respiratory: Negative.   Cardiovascular: Negative.   Gastrointestinal: Positive for abdominal pain.  Endocrine: Negative.   Genitourinary: Positive for vaginal pain.  Musculoskeletal: Negative.   Skin: Negative.   Allergic/Immunologic: Negative.   Neurological: Negative.   Hematological: Negative.   Psychiatric/Behavioral: Negative.     Allergies  Review of patient's allergies indicates no known allergies.  Home Medications  No current outpatient prescriptions on file.  BP 124/69 mmHg  Pulse 100  Temp(Src) 98.6 F (37 C) (Oral)  Resp 18  Ht 5\' 4"  (1.626 m)  Wt 179 lb (81.194 kg)  BMI 30.71 kg/m2  SpO2 100%  LMP 09/24/2015  Physical Exam  Constitutional: She is oriented  to person, place, and time. She appears well-developed and well-nourished.  HENT:  Head: Normocephalic.  Neck: Normal range of motion.  Cardiovascular: Normal rate, regular rhythm, normal heart sounds and intact distal pulses.   Pulmonary/Chest: Effort normal and breath sounds normal.  Abdominal: Soft. Bowel sounds are normal.  Genitourinary: Vagina normal and uterus normal.  Musculoskeletal: Normal range of motion.  Neurological: She is alert and oriented to person, place, and time. She has normal reflexes.  Skin: Skin is warm.  Psychiatric: She has a normal mood and affect. Her behavior is normal. Judgment and thought content normal.    MAU Course  Procedures (including critical care time)  Labs Reviewed  URINALYSIS, ROUTINE W REFLEX MICROSCOPIC (NOT [facility])   No results found.   No diagnosis found.    MDM  SVE 2/90/0, stitch intact but tension noted. Also bulging LUS. Pt to be assessed by Dr. Ellyn HackBovard.

## 2016-05-07 DIAGNOSIS — Z3A34 34 weeks gestation of pregnancy: Secondary | ICD-10-CM | POA: Diagnosis not present

## 2016-05-07 DIAGNOSIS — O3433 Maternal care for cervical incompetence, third trimester: Secondary | ICD-10-CM | POA: Diagnosis present

## 2016-05-07 DIAGNOSIS — Z87891 Personal history of nicotine dependence: Secondary | ICD-10-CM | POA: Diagnosis not present

## 2016-05-07 DIAGNOSIS — O368930 Maternal care for other specified fetal problems, third trimester, not applicable or unspecified: Secondary | ICD-10-CM | POA: Diagnosis present

## 2016-05-07 DIAGNOSIS — O42113 Preterm premature rupture of membranes, onset of labor more than 24 hours following rupture, third trimester: Secondary | ICD-10-CM | POA: Diagnosis present

## 2016-05-07 DIAGNOSIS — O26873 Cervical shortening, third trimester: Secondary | ICD-10-CM | POA: Diagnosis present

## 2016-05-07 DIAGNOSIS — O42913 Preterm premature rupture of membranes, unspecified as to length of time between rupture and onset of labor, third trimester: Secondary | ICD-10-CM | POA: Diagnosis present

## 2016-05-07 LAB — AMNISURE RUPTURE OF MEMBRANE (ROM) NOT AT ARMC: AMNISURE: POSITIVE

## 2016-05-07 MED ORDER — AMOXICILLIN 500 MG PO CAPS
500.0000 mg | ORAL_CAPSULE | Freq: Three times a day (TID) | ORAL | Status: AC
  Administered 2016-05-09 – 2016-05-14 (×15): 500 mg via ORAL
  Filled 2016-05-07 (×15): qty 1

## 2016-05-07 MED ORDER — SODIUM CHLORIDE 0.9 % IV SOLN
2.0000 g | Freq: Four times a day (QID) | INTRAVENOUS | Status: AC
  Administered 2016-05-07 – 2016-05-09 (×8): 2 g via INTRAVENOUS
  Filled 2016-05-07 (×9): qty 2000

## 2016-05-07 MED ORDER — DEXTROSE 5 % IV SOLN
500.0000 mg | INTRAVENOUS | Status: AC
  Administered 2016-05-07 – 2016-05-08 (×2): 500 mg via INTRAVENOUS
  Filled 2016-05-07 (×2): qty 500

## 2016-05-07 MED ORDER — AZITHROMYCIN 250 MG PO TABS
500.0000 mg | ORAL_TABLET | Freq: Every day | ORAL | Status: AC
  Administered 2016-05-09 – 2016-05-13 (×5): 500 mg via ORAL
  Filled 2016-05-07 (×5): qty 2

## 2016-05-07 NOTE — Progress Notes (Signed)
Patient ID: Erica Salas, female   DOB: 1986-05-15, 30 y.o.   MRN: [id number] +amniosure c/w PPROM--pt states occurred around 3 AM and notified RN  No significant contractions, some occasional back pain afeb vss  Will begin antibiotics ampicillin and azithromycin Pt states GBS collected last PM D/w pt if has significant contractions in next 24-48 hours would try magnesium to stop labor until steroids on board and possibly remove  Cerclage.  Will leave in place for now since no labor.

## 2016-05-07 NOTE — Consult Note (Signed)
Neonatology Consult  Note:  At the request of the patients obstetrician Dr. Marvel Plan I met with Erica Salas and father.  She is currently 32 [redacted] weeks pregnant with pregnancy complicated by advanced cervical dilation and cerclage under tension, PPROM on 5/16.  She has received bethamethasone 5/15-16 and is on latency antibiotics.  Fetus with cystic dysplastic kidney. We reviewed initial delivery room management, including CPAP, Antimony, and low but certainly possible need for intubation for surfactant administration.  We discussed feeding immaturity and need for full po intake with multiple days of good weight gain and no apnea or bradycardia before discharge.  We reviewed increased risk of jaundice, infection, and temperature instability.   Discussed likely length of stay.  Thank you for allowing [place] to participate in her care.  Please call with questions.  Higinio Roger, DO  Neonatologist  The total length of face-to-face or floor / unit time for this encounter was 20 minutes.  Counseling and / or coordination of care was greater than fifty percent of the time.

## 2016-05-07 NOTE — Progress Notes (Signed)
Patient ID: Erica Salas, female   DOB: 1986/06/28, 30 y.o.   MRN: [id number]  Pt admitted with cervical dilation and cerclage, admitted for BMZ  ? LOF, clear this am, +FM, no VB, no ctx.  No fern, will do Amnisure  AFVSS gen NAD FHTs 130-150, good var, category 1 toco occ Abd gravid, NT  Will receive BMZ this evening Amniosure to r/o rupture - would not fern Continue close monitoring

## 2016-05-08 LAB — CULTURE, BETA STREP (GROUP B ONLY)

## 2016-05-08 LAB — OB RESULTS CONSOLE GBS
GBS: NEGATIVE
GBS: NEGATIVE

## 2016-05-08 NOTE — Progress Notes (Signed)
Pt is not feeling ctx Afeb, VSS FHT- 130-140, mod variability, + accels, some variable decels, on prolonged decel associated with a ctx, no regular ctx Will continue to monitor ctx, if she starts to feel ctx will remove cerclage

## 2016-05-08 NOTE — Progress Notes (Addendum)
Patient ID: Erica Salas, female   DOB: December 26, 1985, 30 y.o.   MRN: [id number] HD#2/PPROM day #1 Pt is a G2P1001 female at 5532 3/[redacted] wks gestation with PPROM. Pt reports occasional slight leakage overnight - no "gushing". She denies any fever, chills or contractions. She is appreciating FMs VSS; afeb ABD -  c/w ga EXT - no homans  A/P: IUP at 2332 3/7wks with PPROM s/p BMZ on 5/15 and 5/16; currently on latency antibx; with cerclage        S/p neonatology consult        Plan to monitor at this time; if contractions ensue will remove cerclage otherwise will likely           wait till [redacted]weeks gestation to remove cerclage.       Serial cbc q 3 days       Strict bedrest - teds stockings in place

## 2016-05-09 LAB — CBC
HEMATOCRIT: 26 % — AB (ref 36.0–46.0)
HEMOGLOBIN: 8.9 g/dL — AB (ref 12.0–15.0)
MCH: 28.9 pg (ref 26.0–34.0)
MCHC: 34.2 g/dL (ref 30.0–36.0)
MCV: 84.4 fL (ref 78.0–100.0)
Platelets: 276 10*3/uL (ref 150–400)
RBC: 3.08 MIL/uL — AB (ref 3.87–5.11)
RDW: 14 % (ref 11.5–15.5)
WBC: 15.3 10*3/uL — ABNORMAL HIGH (ref 4.0–10.5)

## 2016-05-09 NOTE — Progress Notes (Signed)
Patient ID: Erica Salas, female   DOB: [birth year], 30 y.o.   MRN: [id number] HD#3/PPROM day #2  Pt report no further contractions overnight. Denies any fever or chills. +Fms. No complaints. In good spirits VSS - afeb ABD- soft, c/w ga EXT - teds in place, no homans  WBC- 15 ( up from 14 yesterday)  A/P: IUP at 32 4/7wks with PPROM s/p BMZ on 5/15 and 5/16; currently on latency antibx; with                            Cerclage in place  S/p neonatology consult  Plan to monitor at this time; if contractions ensue or wbc continues to increase despite antibx will                 remove cerclage otherwise will likely wait till [redacted]weeks gestation to remove cerclage.  Recheck cbc 5/19  Strict bedrest - teds stockings in place

## 2016-05-10 LAB — CBC
HEMATOCRIT: 25.4 % — AB (ref 36.0–46.0)
Hemoglobin: 8.6 g/dL — ABNORMAL LOW (ref 12.0–15.0)
MCH: 28.7 pg (ref 26.0–34.0)
MCHC: 33.9 g/dL (ref 30.0–36.0)
MCV: 84.7 fL (ref 78.0–100.0)
Platelets: 264 10*3/uL (ref 150–400)
RBC: 3 MIL/uL — ABNORMAL LOW (ref 3.87–5.11)
RDW: 14 % (ref 11.5–15.5)
WBC: 11.6 10*3/uL — ABNORMAL HIGH (ref 4.0–10.5)

## 2016-05-10 MED ORDER — CYCLOBENZAPRINE HCL 10 MG PO TABS
10.0000 mg | ORAL_TABLET | Freq: Once | ORAL | Status: AC
  Administered 2016-05-10: 10 mg via ORAL
  Filled 2016-05-10: qty 1

## 2016-05-10 MED ORDER — LACTATED RINGERS IV BOLUS (SEPSIS)
250.0000 mL | Freq: Once | INTRAVENOUS | Status: AC
  Administered 2016-05-11: 250 mL via INTRAVENOUS

## 2016-05-10 NOTE — Progress Notes (Addendum)
Patient ID: Erica Salas, female   DOB: 1986-04-15, 30 y.o.   MRN: [id number]  No c/o's  AFVSS gen NAD Abd gravid, fundus NT  FHT 120-150., good var, category 1-2 toco none  PPROM on abx, s/p BMZ, 32+ Will change to Q shift NST

## 2016-05-10 NOTE — Progress Notes (Signed)
Patient ID: Erica Salas, female   DOB: 1986/06/30, 30 y.o.   MRN: [id number] Pt had some severe constant back pain around 1am. No contractions noted on toco or palpated at this time. Pt was afeb. Flexeril given and heating pad  Applied and pt states that completely resolved her back pain - was still sleeping but easily arousable when seen this am. Has no complaints. +Fms VSS ABD- soft, NT Ext - teds in place, no Homans  WBC 11 ( down from 15)  A/P: IUP at 32 5/7wks with PPROM s/p BMZ on 5/15 and 5/16; currently on latency antibx;            Cerclage in place  S/p neonatology consult  Plan to monitor at this time; if contractions ensue or wbc continues to increase despite antibx will   remove cerclage otherwise will likely wait till [redacted]weeks gestation to remove cerclage.  Recheck cbc 5/22 or prn      Strict bedrest; teds in place; flexeril daily and prn

## 2016-05-11 NOTE — Progress Notes (Signed)
Patient ID: Erica Salas, female   DOB: [birth year], 30 y.o.   MRN: [id number]  32+6 with PPROM, A/E for latency, s/p BMZ Cerclage in place  No c/o's.  +FM, cont LOF, no VB, irregular ctx.  Back pain improved  AFVSS gen NAD, sleepy, but arousable Abd soft, gravid NT  FHTs 140-160, good var, category 1-2, some variables toco irregular, rare  Day 5/7 of latency antibiotics Continue close monitoring

## 2016-05-12 LAB — TYPE AND SCREEN
ABO/RH(D): O POS
ANTIBODY SCREEN: NEGATIVE

## 2016-05-12 MED ORDER — SODIUM CHLORIDE 0.9% FLUSH
3.0000 mL | Freq: Two times a day (BID) | INTRAVENOUS | Status: DC
  Administered 2016-05-12 – 2016-05-20 (×15): 3 mL via INTRAVENOUS

## 2016-05-12 NOTE — Progress Notes (Signed)
Patient ID: Erica Salas, female   DOB: [birth year], 30 y.o.   MRN: [id number]  33wk, PPROM, cerclage s/p BMZ, finishing abx for latency  No c/o's.  +FM, continued LOF, no VB, occ ctx  AFVSS gen NAD Abd soft, gravid, NT FHTs 150's, good var, category 1 toco occ  Continue current mgmt

## 2016-05-13 LAB — CBC
HCT: 28.3 % — ABNORMAL LOW (ref 36.0–46.0)
Hemoglobin: 9.5 g/dL — ABNORMAL LOW (ref 12.0–15.0)
MCH: 28.4 pg (ref 26.0–34.0)
MCHC: 33.6 g/dL (ref 30.0–36.0)
MCV: 84.5 fL (ref 78.0–100.0)
PLATELETS: 296 10*3/uL (ref 150–400)
RBC: 3.35 MIL/uL — AB (ref 3.87–5.11)
RDW: 14.1 % (ref 11.5–15.5)
WBC: 13.6 10*3/uL — AB (ref 4.0–10.5)

## 2016-05-13 NOTE — Lactation Note (Addendum)
Lactation Consultation Note  Patient Name: Erica Salas Today's Date: 05/13/2016   [facility] Mom with PROM at 33 weeks. Mom voiced concerns that infant will not be able to BF if he goes to NICU. Reviewed what to expect in regards to lactation if infant is admitted to NICU or if infant is born LPT and stays with mom. Reviewed normal progression of milk coming to volume and expected progression of BF behaviors for NICU and LPT infants. Reviewed pumping for NICU and LPT infants with mom. [facility]   Gave mom Breast Feeding Resources Handout, Taking Care of Baby and Me Booklet, Providing Milk for Your Baby in NICU, and LPT Infant policy. Reviewed the differences with mom. Mom thankful for information. Gave mom hand expression handout with review, enc mom to watch hand expression video before delivery. Will follow up after delilvery.  Gave LC Brochure, informed mom of IP/OP Services, BF Support Groups and LC phone #.     Maternal Data    Feeding    LATCH Score/Interventions                      Lactation Tools Discussed/Used     Consult Status      Erica Salas 05/13/2016, 3:18 PM

## 2016-05-13 NOTE — Progress Notes (Signed)
Patient ID: Erica Salas, female   DOB: 10/21/1986, 30 y.o.   MRN: [id number] Pt is doing well. She denies any contractions, VB or discomfort at this time. Appreciating FMs.  Has questions about plan of care. No fever or chills.  VSS ABD - c/w ga EXT- teds in place  A/P:  IUP at 33 1/7wks with PPROM s/p BMZ on 5/15 and 5/16; currently on day 7/7 of latency antibx;   Cerclage in place  S/p neonatology consult  Plan to monitor at this time; if painful persistent contractions ensue or wbc increases will              remove cerclage otherwise will likely wait till [redacted]weeks gestation to remove cerclage.  CBC stable  Strict bedrest; teds in place; flexeril daily and prn

## 2016-05-14 LAB — CBC WITH DIFFERENTIAL/PLATELET
Basophils Absolute: 0 10*3/uL (ref 0.0–0.1)
Basophils Relative: 0 %
EOS ABS: 0.6 10*3/uL (ref 0.0–0.7)
EOS PCT: 4 %
HCT: 31 % — ABNORMAL LOW (ref 36.0–46.0)
Hemoglobin: 10.4 g/dL — ABNORMAL LOW (ref 12.0–15.0)
LYMPHS ABS: 3.9 10*3/uL (ref 0.7–4.0)
Lymphocytes Relative: 25 %
MCH: 28.4 pg (ref 26.0–34.0)
MCHC: 33.5 g/dL (ref 30.0–36.0)
MCV: 84.7 fL (ref 78.0–100.0)
MONOS PCT: 7 %
Monocytes Absolute: 1 10*3/uL (ref 0.1–1.0)
Neutro Abs: 10.3 10*3/uL — ABNORMAL HIGH (ref 1.7–7.7)
Neutrophils Relative %: 65 %
PLATELETS: 331 10*3/uL (ref 150–400)
RBC: 3.66 MIL/uL — AB (ref 3.87–5.11)
RDW: 14.3 % (ref 11.5–15.5)
WBC: 15.9 10*3/uL — AB (ref 4.0–10.5)

## 2016-05-14 NOTE — Progress Notes (Signed)
Patient ID: Erica Salas, female   DOB: 10-Aug-1986, 30 y.o.   MRN: [id number] Pt comfortable in bed; eating breakfast. Reports no fever, chills or contractions. +Fms. Has no complaints. Understands plan of care VSS ABD- c/w ga EXT - no homans. teds in place  Wbc 15.9 ( up from 13.6)  A/P: IUP at 33 2/7wks with PPROM s/p BMZ on 5/15 and 5/16;         S/p latency antibx;   Cerclage in place  S/p neonatology consult  Plan to monitor at this time; if signs labor or infection or fetal distress will    remove cerclage otherwise will likely wait till [redacted]weeks gestation to remove cerclage.  CBC daily   Teds in place; flexeril daily and prn

## 2016-05-15 LAB — CBC
HCT: 31.6 % — ABNORMAL LOW (ref 36.0–46.0)
HEMATOCRIT: 31 % — AB (ref 36.0–46.0)
HEMOGLOBIN: 10.6 g/dL — AB (ref 12.0–15.0)
Hemoglobin: 10.4 g/dL — ABNORMAL LOW (ref 12.0–15.0)
MCH: 28.3 pg (ref 26.0–34.0)
MCH: 28.5 pg (ref 26.0–34.0)
MCHC: 33.5 g/dL (ref 30.0–36.0)
MCHC: 33.5 g/dL (ref 30.0–36.0)
MCV: 84.5 fL (ref 78.0–100.0)
MCV: 84.9 fL (ref 78.0–100.0)
PLATELETS: 346 10*3/uL (ref 150–400)
Platelets: 342 10*3/uL (ref 150–400)
RBC: 3.74 MIL/uL — AB (ref 3.87–5.11)
RBC: 3.65 MIL/uL — AB (ref 3.87–5.11)
RDW: 14.4 % (ref 11.5–15.5)
RDW: 14.4 % (ref 11.5–15.5)
WBC: 18.2 10*3/uL — ABNORMAL HIGH (ref 4.0–10.5)
WBC: 18.6 10*3/uL — AB (ref 4.0–10.5)

## 2016-05-15 LAB — TYPE AND SCREEN
ABO/RH(D): O POS
Antibody Screen: NEGATIVE

## 2016-05-15 MED ORDER — SODIUM CHLORIDE 0.9 % IV SOLN
1.5000 g | Freq: Four times a day (QID) | INTRAVENOUS | Status: AC
  Administered 2016-05-15 – 2016-05-19 (×19): 1.5 g via INTRAVENOUS
  Filled 2016-05-15 (×19): qty 1.5

## 2016-05-15 NOTE — Progress Notes (Signed)
Patient ID: Erica Salas, female   DOB: August 02, 1986, 30 y.o.   MRN: [id number] Pt reports increased vaginal discomfort and pressure - not specifically contractions. She denies any fever or chills VSS Toco - no contractions EFM - cat 1  Wbc 18 (up from 15)          IUP at 33 3/[redacted]wks ga s/p bmz on 5/15 and 5/16 A/P: Reviewed concern of chorioamnionitis developing. Will start on unasyn today ( s/p latency antibx)         Cerclage removed - sve 3/70/-2; copious clear fluid         If contractions ensue, pt aware likely plan for delivery to be implemented        S/p neonatology consult

## 2016-05-15 NOTE — Progress Notes (Signed)
Patient ID: Erica Salas, female   DOB: 12/16/1986, 30 y.o.   MRN: [id number] Pt feels ok s/p cerclage removal this AM.  Some vaginal pressure and occasional contractions, but nothing regular.  Light vaginal spotting and LOF  FHR reactive/Category 1 baseline 130 afeb vss Fundus NT  33 3/7 PPROM S/p cerclage removal this AM for rising WBC, no s/s of chorio Continue expectant management

## 2016-05-15 NOTE — Progress Notes (Signed)
Initial Nutrition Assessment  DOCUMENTATION CODES:  Not applicable  INTERVENTION:  Regular diet, May order double protein portions, snacks TID and from retail  NUTRITION DIAGNOSIS:  Increased nutrient needs related to  (pregnancy and fetal growth requirements) as evidenced by  (33 weeks IUP).  GOAL:  Patient will meet greater than or equal to 90% of their needs  MONITOR:  Weight trends  REASON FOR ASSESSMENT:  Antenatal    ASSESSMENT:  33 3/7 weeks, PTL. Pre-preg weight of 149 lbs.BMI 25.6  25 Lb weight gain. Diet tol well, good appetite   Diet Order:  Diet regular Room service appropriate?: Yes; Fluid consistency:: Thin  Skin:  Reviewed, no issues  Height:   Ht Readings from Last 1 Encounters:  05/06/16 5\' 4"  (1.626 m)    Weight:   Wt Readings from Last 1 Encounters:  05/15/16 174 lb 9.6 oz (79.198 kg)    Ideal Body Weight:  54.5 kg  BMI:  Body mass index is 29.96 kg/(m^2).  Estimated Nutritional Needs:   Kcal:  1800-2000  Protein:  80-90 g  Fluid:  2.1 L  EDUCATION NEEDS:   No education needs identified at this time  Inez PilgrimKatherine Jaray Boliver M.Odis LusterEd. R.D. LDN Neonatal Nutrition Support Specialist/RD III Pager [phone]      Phone [phone]

## 2016-05-16 LAB — CBC
HCT: 30.5 % — ABNORMAL LOW (ref 36.0–46.0)
Hemoglobin: 10.4 g/dL — ABNORMAL LOW (ref 12.0–15.0)
MCH: 28.7 pg (ref 26.0–34.0)
MCHC: 34.1 g/dL (ref 30.0–36.0)
MCV: 84 fL (ref 78.0–100.0)
PLATELETS: 346 10*3/uL (ref 150–400)
RBC: 3.63 MIL/uL — AB (ref 3.87–5.11)
RDW: 14.5 % (ref 11.5–15.5)
WBC: 16.8 10*3/uL — AB (ref 4.0–10.5)

## 2016-05-16 NOTE — Progress Notes (Signed)
Patient ID: Erica Salas, female   DOB: 03/07/1986, 30 y.o.   MRN: [id number] Pt denies any contractions, Vb or fever/chills. Continues to have LOF - clear. No complaints. +Fms VSS- afeb EFM- cat 1, baseline 140s TOCO- no contractions SVE - deferred  Wbc 16 ( down from 18)  A/P:  IUP at 33 4/[redacted]wks ga s/p bmz on 5/15 and 5/16        Continue on unasyn ( s/p latency antibx); wbc improving  Cerclage removed 5/24  If contractions ensue, pt aware likely plan for delivery to be implemented   S/p neonatology consult

## 2016-05-17 LAB — CBC
HEMATOCRIT: 31.8 % — AB (ref 36.0–46.0)
HEMOGLOBIN: 10.7 g/dL — AB (ref 12.0–15.0)
MCH: 28.7 pg (ref 26.0–34.0)
MCHC: 33.6 g/dL (ref 30.0–36.0)
MCV: 85.3 fL (ref 78.0–100.0)
Platelets: 350 10*3/uL (ref 150–400)
RBC: 3.73 MIL/uL — AB (ref 3.87–5.11)
RDW: 14.6 % (ref 11.5–15.5)
WBC: 16.5 10*3/uL — AB (ref 4.0–10.5)

## 2016-05-17 NOTE — Progress Notes (Signed)
Patient ID: Kirt Boysshley Rakes, female   DOB: 1986-02-13, 30 y.o.   MRN: [id number] Pt doing well. No fever or chills. Feels a little crampy but denies regular contractions. +Fms VSS - afeb EFM - cat 1; baseline in 130s TOCO - rare contraction SVE - deferred  WBC - stable at 16  A/P: iup at 33 5/[redacted]wks gestation - stable; s/p cerclage removed on 5/24         s/p bmz on 5/15 and 5/16   Continue on unasyn ( s/p latency antibx); wbc stable  If contractions ensue, pt aware likely plan for delivery to be implemented  S/p neonatology consult

## 2016-05-17 NOTE — Progress Notes (Signed)
Patient ID: Erica Salas, female   DOB: 12/18/1986, 30 y.o.   MRN: [id number] Pt having contractions about every 15mins. Has had bouts of contractions closely spaced which then resolve after a few hours all day. Rates at 6-7/10 at this time. No pelvic pressure or VB. +LOF and Fms VSS- afeb EFM- 130s, cat 1 TOCO - contractions q 15mins SVE - deferred  A/P: iup at 3033 5/7wks with PPROM s/p bmz; on unasyn        If progresses to preterm labor based on  contraction pattern of symptoms will plan for expectant management

## 2016-05-18 LAB — CBC
HCT: 30.3 % — ABNORMAL LOW (ref 36.0–46.0)
HEMOGLOBIN: 10.3 g/dL — AB (ref 12.0–15.0)
MCH: 28.7 pg (ref 26.0–34.0)
MCHC: 34 g/dL (ref 30.0–36.0)
MCV: 84.4 fL (ref 78.0–100.0)
Platelets: 331 10*3/uL (ref 150–400)
RBC: 3.59 MIL/uL — AB (ref 3.87–5.11)
RDW: 14.5 % (ref 11.5–15.5)
WBC: 13.9 10*3/uL — AB (ref 4.0–10.5)

## 2016-05-18 LAB — TYPE AND SCREEN
ABO/RH(D): O POS
ANTIBODY SCREEN: NEGATIVE

## 2016-05-18 NOTE — Progress Notes (Signed)
Patient ID: Erica Salas, female   DOB: 1986-12-14, 30 y.o.   MRN: [id number] Pt slept well overnight - no painful contractions. She denies fever or chills. +Fms. No complaints VSS- afeb EFM- 150s, cat 1 TOCO - rare contraction  SVE - deferred  Wbc - 13.9 ( down from 15)  A/P: up at 33 6/[redacted]wks gestation - stable; s/p cerclage removed on 5/24  s/p bmz on 5/15 and 5/16  Continue on unasyn ( s/p latency antibx); wbc decreasing. Will stop at 34weeks  If contractions ensue, pt aware likely plan for delivery to be implemented      S/p neonatology consult

## 2016-05-19 LAB — CBC
HCT: 29.7 % — ABNORMAL LOW (ref 36.0–46.0)
Hemoglobin: 10 g/dL — ABNORMAL LOW (ref 12.0–15.0)
MCH: 28.4 pg (ref 26.0–34.0)
MCHC: 33.7 g/dL (ref 30.0–36.0)
MCV: 84.4 fL (ref 78.0–100.0)
Platelets: 325 K/uL (ref 150–400)
RBC: 3.52 MIL/uL — ABNORMAL LOW (ref 3.87–5.11)
RDW: 14.5 % (ref 11.5–15.5)
WBC: 13 K/uL — ABNORMAL HIGH (ref 4.0–10.5)

## 2016-05-19 NOTE — Progress Notes (Signed)
Patient ID: Erica Salas, female   DOB: June 01, 1986, 30 y.o.   MRN: [id number] Pt doing well. No change in cramps- no contractions or VB. Denies any fever, cp or sob. +Fms and clear LOF. Hoping to make it to 36weeks VSS-afeb EFM- 150s, cat 1 TOCO- no contractions SVE -deferred  WBC- 13 (from 13.9)  A/P: up at 34 0/[redacted] wks gestation - stable; s/p cerclage removed on 5/24  s/p bmz on 5/15 and 5/16  Continue on unasyn ( s/p latency antibx); wbc decreasing. Will stop today  If contractions ensue, pt aware likely plan for delivery to be implemented  S/p neonatology consult

## 2016-05-20 ENCOUNTER — Encounter (HOSPITAL_COMMUNITY): Payer: Self-pay

## 2016-05-20 ENCOUNTER — Inpatient Hospital Stay (HOSPITAL_COMMUNITY): Payer: Medicaid Other | Admitting: Anesthesiology

## 2016-05-20 LAB — CBC
HCT: 29.4 % — ABNORMAL LOW (ref 36.0–46.0)
Hemoglobin: 9.9 g/dL — ABNORMAL LOW (ref 12.0–15.0)
MCH: 28.5 pg (ref 26.0–34.0)
MCHC: 33.7 g/dL (ref 30.0–36.0)
MCV: 84.7 fL (ref 78.0–100.0)
PLATELETS: 313 10*3/uL (ref 150–400)
RBC: 3.47 MIL/uL — AB (ref 3.87–5.11)
RDW: 14.5 % (ref 11.5–15.5)
WBC: 15.6 10*3/uL — AB (ref 4.0–10.5)

## 2016-05-20 MED ORDER — ONDANSETRON 4 MG PO TBDP
4.0000 mg | ORAL_TABLET | Freq: Four times a day (QID) | ORAL | Status: DC | PRN
  Administered 2016-05-20: 4 mg via ORAL
  Filled 2016-05-20 (×2): qty 1

## 2016-05-20 MED ORDER — DIPHENHYDRAMINE HCL 50 MG/ML IJ SOLN: 12.5000 mg | INTRAMUSCULAR | Status: DC | PRN

## 2016-05-20 MED ORDER — EPHEDRINE 5 MG/ML INJ
10.0000 mg | INTRAVENOUS | Status: DC | PRN
  Filled 2016-05-20: qty 2

## 2016-05-20 MED ORDER — FENTANYL 2.5 MCG/ML BUPIVACAINE 1/10 % EPIDURAL INFUSION (WH - ANES)
14.0000 mL/h | INTRAMUSCULAR | Status: DC | PRN
Start: 2016-05-20 — End: 2016-05-21
  Administered 2016-05-20 (×2): 14 mL/h via EPIDURAL

## 2016-05-20 MED ORDER — PHENYLEPHRINE 40 MCG/ML (10ML) SYRINGE FOR IV PUSH (FOR BLOOD PRESSURE SUPPORT)
80.0000 ug | PREFILLED_SYRINGE | INTRAVENOUS | Status: DC | PRN
  Filled 2016-05-20: qty 5

## 2016-05-20 MED ORDER — PHENYLEPHRINE 40 MCG/ML (10ML) SYRINGE FOR IV PUSH (FOR BLOOD PRESSURE SUPPORT)
PREFILLED_SYRINGE | INTRAVENOUS | Status: AC
  Filled 2016-05-20: qty 20

## 2016-05-20 MED ORDER — LACTATED RINGERS IV SOLN
500.0000 mL | Freq: Once | INTRAVENOUS | Status: AC
  Administered 2016-05-20: 500 mL via INTRAVENOUS

## 2016-05-20 MED ORDER — LIDOCAINE HCL (PF) 1 % IJ SOLN
INTRAMUSCULAR | Status: DC | PRN
  Administered 2016-05-20 (×2): 4 mL

## 2016-05-20 MED ORDER — FENTANYL 2.5 MCG/ML BUPIVACAINE 1/10 % EPIDURAL INFUSION (WH - ANES)
INTRAMUSCULAR | Status: AC
  Filled 2016-05-20: qty 125

## 2016-05-20 MED ORDER — CYCLOBENZAPRINE HCL 10 MG PO TABS
10.0000 mg | ORAL_TABLET | Freq: Three times a day (TID) | ORAL | Status: DC | PRN
  Administered 2016-05-20: 10 mg via ORAL
  Filled 2016-05-20 (×2): qty 1

## 2016-05-20 MED ORDER — FENTANYL 2.5 MCG/ML BUPIVACAINE 1/10 % EPIDURAL INFUSION (WH - ANES): 14.0000 mL/h | INTRAMUSCULAR | Status: DC | PRN

## 2016-05-20 NOTE — Anesthesia Preprocedure Evaluation (Signed)
Anesthesia Evaluation  Patient identified by MRN, date of birth, ID band Patient awake    Reviewed: Allergy & Precautions, NPO status , Patient's Chart, lab work & pertinent test results  History of Anesthesia Complications Negative for: history of anesthetic complications  Airway Mallampati: II  TM Distance: >3 FB Neck ROM: Full    Dental no notable dental hx. (+) Dental Advisory Given   Pulmonary former smoker,    Pulmonary exam normal breath sounds clear to auscultation       Cardiovascular negative cardio ROS Normal cardiovascular exam Rhythm:Regular Rate:Normal     Neuro/Psych negative neurological ROS  negative psych ROS   GI/Hepatic negative GI ROS, Neg liver ROS,   Endo/Other  negative endocrine ROS  Renal/GU negative Renal ROS  negative genitourinary   Musculoskeletal negative musculoskeletal ROS (+)   Abdominal   Peds negative pediatric ROS (+)  Hematology negative hematology ROS (+)   Anesthesia Other Findings   Reproductive/Obstetrics (+) Pregnancy 20 weeks                             Anesthesia Physical  Anesthesia Plan  ASA: II  Anesthesia Plan: Epidural   Post-op Pain Management:    Induction:   Airway Management Planned:   Additional Equipment:   Intra-op Plan:   Post-operative Plan:   Informed Consent: I have reviewed the patients History and Physical, chart, labs and discussed the procedure including the risks, benefits and alternatives for the proposed anesthesia with the patient or authorized representative who has indicated his/her understanding and acceptance.     Plan Discussed with:   Anesthesia Plan Comments:         Anesthesia Quick Evaluation

## 2016-05-20 NOTE — Progress Notes (Signed)
I was referred by pt's nurse as pt has been here for a significant length of stay.    Erica Salas expressed mixed emotions: gratitude that her baby made it to 34 weeks and, at the same time, frustration that she now is looking at 36 weeks for delivery.  I affirmed that we often have mixed emotions and that we can hold both of those emotions at the same time.  She stated that several family members, without meaning to, had made her feel guilty for feeling frustrated with this hospitalization because it is what is best for her baby.  I affirmed that being in the hospital is not easy and that she need not feel guilty for having some feelings about it. She says that over all, however, her family has been supportive.  As she looks to a new date on the calendar for her goal, I offered to bring her some coloring sheets and magazines.  She was grateful for this.  She is also grateful for all of the support she has received here at the hospital.  [facility] Hospital[facility]plain Katy Zelie Asbill, Bcc Pager, [phone] 3:44 PM    05/20/16 1500  Clinical Encounter Type  Visited With Patient  Visit Type Spiritual support  Referral From Nurse  Spiritual Encounters  Spiritual Needs Emotional  Stress Factors  Patient Stress Factors Loss of control;Exhausted

## 2016-05-20 NOTE — Progress Notes (Addendum)
[redacted]W[redacted]D, PPROM, incompetent cervix Having occasional ctx and pain Afeb, VSS FHT- 150s, mod variability, + accels, occasional variable decel, Cat II, rare irregular ctx Continue observation per Dr. Mindi SlickerBanga, cerclage is out, would not tocolyze

## 2016-05-20 NOTE — Progress Notes (Signed)
Pt has been having pain off and on throughout the day, started to have more back pain and more regular ctx.  VE per RN was 5/90/0, transferred to L&D and has received an epidural. FHT- 150s, mod variability, + accels, no decels, Cat I, ctx q 4 min VE-8/90/0 GBS was neg on 5-15 Will anticipate SVD

## 2016-05-21 ENCOUNTER — Encounter (HOSPITAL_COMMUNITY): Payer: Self-pay | Admitting: *Deleted

## 2016-05-21 MED ORDER — METHYLERGONOVINE MALEATE 0.2 MG PO TABS: 0.2000 mg | ORAL_TABLET | ORAL | Status: DC | PRN

## 2016-05-21 MED ORDER — SIMETHICONE 80 MG PO CHEW
80.0000 mg | CHEWABLE_TABLET | ORAL | Status: DC | PRN
Start: 2016-05-21 — End: 2016-05-23

## 2016-05-21 MED ORDER — OXYTOCIN 40 UNITS IN LACTATED RINGERS INFUSION - SIMPLE MED
1.0000 m[IU]/min | INTRAVENOUS | Status: DC
  Administered 2016-05-21: 1 m[IU]/min via INTRAVENOUS

## 2016-05-21 MED ORDER — MEASLES, MUMPS & RUBELLA VAC ~~LOC~~ INJ
0.5000 mL | INJECTION | Freq: Once | SUBCUTANEOUS | Status: DC
  Filled 2016-05-21: qty 0.5

## 2016-05-21 MED ORDER — ZOLPIDEM TARTRATE 5 MG PO TABS: 5.0000 mg | ORAL_TABLET | Freq: Every evening | ORAL | Status: DC | PRN

## 2016-05-21 MED ORDER — METHYLERGONOVINE MALEATE 0.2 MG/ML IJ SOLN: 0.2000 mg | INTRAMUSCULAR | Status: DC | PRN

## 2016-05-21 MED ORDER — DIPHENHYDRAMINE HCL 25 MG PO CAPS: 25.0000 mg | ORAL_CAPSULE | Freq: Four times a day (QID) | ORAL | Status: DC | PRN

## 2016-05-21 MED ORDER — MAGNESIUM HYDROXIDE 400 MG/5ML PO SUSP: 30.0000 mL | ORAL | Status: DC | PRN

## 2016-05-21 MED ORDER — ONDANSETRON HCL 4 MG PO TABS: 4.0000 mg | ORAL_TABLET | ORAL | Status: DC | PRN

## 2016-05-21 MED ORDER — COCONUT OIL OIL: 1.0000 "application " | TOPICAL_OIL | Status: DC | PRN

## 2016-05-21 MED ORDER — LIDOCAINE HCL (PF) 1 % IJ SOLN
INTRAMUSCULAR | Status: AC
  Filled 2016-05-21: qty 30

## 2016-05-21 MED ORDER — SENNOSIDES-DOCUSATE SODIUM 8.6-50 MG PO TABS
2.0000 | ORAL_TABLET | ORAL | Status: DC
  Administered 2016-05-21 – 2016-05-22 (×2): 2 via ORAL
  Filled 2016-05-21 (×2): qty 2

## 2016-05-21 MED ORDER — BENZOCAINE-MENTHOL 20-0.5 % EX AERO: 1.0000 "application " | INHALATION_SPRAY | CUTANEOUS | Status: DC | PRN

## 2016-05-21 MED ORDER — PRENATAL MULTIVITAMIN CH
1.0000 | ORAL_TABLET | Freq: Every day | ORAL | Status: DC
  Administered 2016-05-21 – 2016-05-22 (×2): 1 via ORAL
  Filled 2016-05-21 (×2): qty 1

## 2016-05-21 MED ORDER — IBUPROFEN 600 MG PO TABS
600.0000 mg | ORAL_TABLET | Freq: Four times a day (QID) | ORAL | Status: DC
  Administered 2016-05-21 – 2016-05-23 (×9): 600 mg via ORAL
  Filled 2016-05-21 (×9): qty 1

## 2016-05-21 MED ORDER — OXYCODONE HCL 5 MG PO TABS: 10.0000 mg | ORAL_TABLET | ORAL | Status: DC | PRN

## 2016-05-21 MED ORDER — WITCH HAZEL-GLYCERIN EX PADS: 1.0000 "application " | MEDICATED_PAD | CUTANEOUS | Status: DC | PRN

## 2016-05-21 MED ORDER — DIBUCAINE 1 % RE OINT: 1.0000 "application " | TOPICAL_OINTMENT | RECTAL | Status: DC | PRN

## 2016-05-21 MED ORDER — OXYCODONE HCL 5 MG PO TABS
5.0000 mg | ORAL_TABLET | ORAL | Status: DC | PRN
  Administered 2016-05-22: 5 mg via ORAL
  Filled 2016-05-21: qty 1

## 2016-05-21 MED ORDER — TETANUS-DIPHTH-ACELL PERTUSSIS 5-2.5-18.5 LF-MCG/0.5 IM SUSP: 0.5000 mL | Freq: Once | INTRAMUSCULAR | Status: DC

## 2016-05-21 MED ORDER — TERBUTALINE SULFATE 1 MG/ML IJ SOLN: 0.2500 mg | Freq: Once | INTRAMUSCULAR | Status: DC | PRN

## 2016-05-21 MED ORDER — ONDANSETRON HCL 4 MG/2ML IJ SOLN: 4.0000 mg | INTRAMUSCULAR | Status: DC | PRN

## 2016-05-21 MED ORDER — OXYTOCIN 40 UNITS IN LACTATED RINGERS INFUSION - SIMPLE MED
INTRAVENOUS | Status: AC
  Filled 2016-05-21: qty 1000

## 2016-05-21 MED ORDER — ACETAMINOPHEN 325 MG PO TABS: 650.0000 mg | ORAL_TABLET | ORAL | Status: DC | PRN

## 2016-05-21 NOTE — Anesthesia Postprocedure Evaluation (Signed)
Anesthesia Post Note  Patient: Erica Salas  Procedure(s) Performed: * No procedures listed *  Patient location during evaluation: [facility] Anesthesia Type: Epidural Level of consciousness: awake and alert Pain management: pain level controlled Vital Signs Assessment: post-procedure vital signs reviewed and stable Respiratory status: spontaneous breathing, nonlabored ventilation and respiratory function stable Cardiovascular status: stable Postop Assessment: no headache, no backache and epidural receding Anesthetic complications: no     Last Vitals:  Filed Vitals:   05/21/16 0930 05/21/16 1236  BP: 123/77 118/72  Pulse: 76 81  Temp: 36.9 C 36.4 C  Resp: 18 18    Last Pain:  Filed Vitals:   05/21/16 1238  PainSc: 2    Pain Goal: Patients Stated Pain Goal: 3 (05/20/16 2038)               Junious SilkGILBERT,Jeanclaude Wentworth

## 2016-05-21 NOTE — Progress Notes (Signed)
CSW acknowledges NICU admission.    Patient screened out for psychosocial assessment since none of the following apply:  Psychosocial stressors documented in mother or baby's chart  Gestation less than 32 weeks  Code at delivery   Infant with anomalies  Please contact the Clinical Social Worker if specific needs arise, or by MOB's request.       

## 2016-05-21 NOTE — Lactation Note (Signed)
This note was copied from a baby's chart. Lactation Consultation Note  Patient Name: Erica Salas ZOXWR'UToday's Date: 05/21/2016 Reason for consult: Initial assessment;NICU baby;Infant < 6lbs;Late preterm infant   Initial consult with first time mom of 679 hour old NICU Infant at infant's bedside. Infant was born this morning at 781w2d GA. Mom has been pumping and brought 2 cc EBM to NICU for infant. Mom was in NICU visiting infant and and was attempting to BF. Infant latched on easily several times in cross cradle hold. After about 15 minutes and after receiving 2 cc EBM via syringe, he latched with flanged lips and formed a rhythmic sucking pattern and was noted to have intermittent swallows. BF basics reviewed with mom. Mom did well with handling and massaging breast with feedings.   Reviewed NL BF feeding behavior of LPT infant and enc mom to BF infant as she and he are able. Enc mom to pump every 2-3 hour on Initiate setting with DEBP followed by hand expression. Mom with compressible breasts/areola and everted nipples. Colostrum very easily expressible in large gtts. Mom noted cramping with feeding, discussed this is hormones that are assisting in returning uterus to normal size and that is generally resolves within the first 3-5 days after delivery. Enc mom to continue motrin/pain medication as needed.   Will follow up with mom later today to reviewed hand expression and to set up SNS at breast to allow infant to get supplement while at breast, mom agreeable.     Maternal Data Formula Feeding for Exclusion: No Has patient been taught Hand Expression?: Yes Does the patient have breastfeeding experience prior to this delivery?: No  Feeding Feeding Type: Formula Length of feed: 15 min  LATCH Score/Interventions Latch: Grasps breast easily, tongue down, lips flanged, rhythmical sucking.  Audible Swallowing: Spontaneous and intermittent  Type of Nipple: Everted at rest and after  stimulation  Comfort (Breast/Nipple): Soft / non-tender     Hold (Positioning): Assistance needed to correctly position infant at breast and maintain latch.  LATCH Score: 9  Lactation Tools Discussed/Used WIC Program: Yes Pump Review: Setup, frequency, and cleaning;Milk Storage Initiated by:: Bedside RN/Reviewed with mom Date initiated:: 05/21/16   Consult Status Consult Status: Follow-up Date: 05/21/16 Follow-up type: In-patient    Silas FloodSharon S Kerilyn Cortner 05/21/2016, 12:21 PM

## 2016-05-21 NOTE — Anesthesia Procedure Notes (Signed)
Epidural Patient location during procedure: OB  Staffing Anesthesiologist: Rajon Bisig Performed by: anesthesiologist   Preanesthetic Checklist Completed: patient identified, pre-op evaluation, timeout performed, IV checked, risks and benefits discussed and monitors and equipment checked  Epidural Patient position: sitting Prep: site prepped and draped and DuraPrep Patient monitoring: heart rate Approach: midline Location: L3-L4 Injection technique: LOR air and LOR saline  Needle:  Needle type: Tuohy  Needle gauge: 17 G Needle length: 9 cm Needle insertion depth: 6 cm Catheter type: closed end flexible Catheter size: 19 Gauge Catheter at skin depth: 12 cm Test dose: negative  Assessment Sensory level: T8 Events: blood not aspirated, injection not painful, no injection resistance, negative IV test and no paresthesia  Additional Notes Reason for block:procedure for pain   

## 2016-05-21 NOTE — Progress Notes (Signed)
Patient ID: Erica Salas, female   DOB: Jan 12, 1986, 30 y.o.   MRN: [id number] Pt doing well. Pain controlled, no fever or chills. No complaints VSS ABD- soft, Fundus firm EXT- no Homans  A/P: PPD#0 - stable        Routine postpartum visit

## 2016-05-21 NOTE — Lactation Note (Signed)
This note was copied from a baby's chart. Lactation Consultation Note  Patient Name: Erica Salas WUJWJ'XToday's Date: 05/21/2016 Reason for consult: Follow-up assessment;NICU baby;Infant < 6lbs;Late preterm infant   Follow up with mom in NICU for infant feeding. Infant was placed to left breast in cross cradle hold with 5 fr feeding tube placed to breast. Infant had a difficult time latching to left nipple as left nipple larger than right. Infant was then placed to right breast with 5 fr feeding tube. It was noted that when infant starting to get flow from 5 fr feeding tube, he tongue thrusts and pushes nipple out of mouth. Communicated with bedside RN, Florentina AddisonKatie. Enc mom to try BF when infant and she is available as he tolerates. Enc her to continue pumping and hand expressing to stimulate milk production. Follow up tomorrow and prn.    Maternal Data Formula Feeding for Exclusion: No Has patient been taught Hand Expression?: Yes Does the patient have breastfeeding experience prior to this delivery?: No (Did not BF first child)  Feeding Feeding Type: Breast Fed Length of feed: 30 min  LATCH Score/Interventions Latch: Repeated attempts needed to sustain latch, nipple held in mouth throughout feeding, stimulation needed to elicit sucking reflex. Intervention(s): Adjust position;Assist with latch;Breast massage;Breast compression  Audible Swallowing: Spontaneous and intermittent (With SNS)  Type of Nipple: Everted at rest and after stimulation  Comfort (Breast/Nipple): Soft / non-tender     Hold (Positioning): Assistance needed to correctly position infant at breast and maintain latch. Intervention(s): Breastfeeding basics reviewed;Support Pillows;Position options;Skin to skin  LATCH Score: 8  Lactation Tools Discussed/Used Tools: 38F feeding tube / Syringe Pump Review: Setup, frequency, and cleaning;Milk Storage   Consult Status Consult Status: Follow-up Date: 05/22/16 Follow-up  type: In-patient    Erica Salas 05/21/2016, 3:39 PM

## 2016-05-22 NOTE — Lactation Note (Signed)
This note was copied from a baby'Salas chart. Lactation Consultation Note  Follow up visit made.  Mom states she is pumping every 3 hours and obtaining a few teaspoons.  Reassured and discussed milk coming to volume.  [facility]  No questions at present.  Encouraged to call for concerns prn.  Patient Name: Erica Salas ZOXWR'UToday'Salas Date: 05/22/2016     Maternal Data    Feeding Feeding Type: Formula Length of feed: 25 min  LATCH Score/Interventions                      Lactation Tools Discussed/Used     Consult Status      Erica Salas, Erica Salas 05/22/2016, 11:10 AM

## 2016-05-22 NOTE — Progress Notes (Signed)
PPD #1 No problems, baby stable in NICU Afeb, VSS Fundus firm, NT at U-1 Continue routine postpartum care 

## 2016-05-23 MED ORDER — IBUPROFEN 600 MG PO TABS
600.0000 mg | ORAL_TABLET | Freq: Four times a day (QID) | ORAL | Status: AC
Start: 2016-05-23 — End: ?

## 2016-05-23 NOTE — Progress Notes (Signed)
PPD #2 No problems, baby stable in NICU Afeb, VSS Fundus firm D/c home

## 2016-05-23 NOTE — Progress Notes (Signed)
Pt. Discharged in the care of friend, Downstairs per ambulatory,with R.N. Escort. Infant to remain in NICu. Discharged instructions with Rx  were given to pt. With good understanding. Questions asked and answered. Denies vaginal bleedeing and pain.

## 2016-05-23 NOTE — Discharge Instructions (Signed)
As per discharge pamphlet °

## 2016-05-23 NOTE — Discharge Summary (Signed)
    OB Discharge Summary     Patient Name: Erica Salas DOB: 1986/08/14 MRN: [id number]  Date of admission: 05/06/2016 Delivering MD: Jackelyn KnifeMEISINGER, Gerlene Glassburn   Date of discharge: 05/23/2016  Admitting diagnosis: 32W pain in lady part Intrauterine pregnancy: 7740w2d     Secondary diagnosis:  Active Problems:   Risk of preterm labor   Preterm premature rupture of membranes in third trimester  Additional problems: Incompetent cervix     Discharge diagnosis: Preterm Pregnancy Delivered and PPROM, incompetent cervix                                    Hospital course:  Pt admitted on 5-15 with abdominal trauma, found to have PPROM.  Received steroids and antibiotics, cerclage left in place.  She did well for 2+ weeks.  She started to have more ctx and cerclage was removed.  On the evening of 5-29 she had more regular ctx and cervix was 4 cm dilated.  She progressed into labor and had SVD of a viable female.  No postpartum complications, baby did well in NICU.  Physical exam  Filed Vitals:   05/22/16 1201 05/22/16 1905 05/22/16 2136 05/23/16 0500  BP: 122/70 137/80 123/70 142/91  Pulse: 65 83 79 78  Temp: 98.4 F (36.9 C) 98.4 F (36.9 C) 98.8 F (37.1 C) 98.8 F (37.1 C)  TempSrc: Oral Oral Oral Oral  Resp: 18 18 16 18   Height:      Weight:      SpO2: 100% 100% 100% 100%   General: alert Lochia: appropriate Uterine Fundus: firm  Labs: Lab Results  Component Value Date   WBC 15.6* 05/20/2016   HGB 9.9* 05/20/2016   HCT 29.4* 05/20/2016   MCV 84.7 05/20/2016   PLT 313 05/20/2016   No flowsheet data found.  Discharge instruction: per After Visit Summary and "Baby and Me Booklet".  After visit meds:    Medication List    STOP taking these medications        progesterone 200 MG capsule  Commonly known as:  PROMETRIUM      TAKE these medications        acetaminophen 500 MG tablet  Commonly known as:  TYLENOL  Take 500 mg by mouth every 6 (six) hours as needed for mild  pain or headache.     ibuprofen 600 MG tablet  Commonly known as:  ADVIL,MOTRIN  Take 1 tablet (600 mg total) by mouth every 6 (six) hours.     prenatal multivitamin Tabs tablet  Take 1 tablet by mouth daily at 12 noon.        Diet: routine diet  Activity: Advance as tolerated. Pelvic rest for 6 weeks.   Outpatient follow up:6 weeks   Newborn Data: Live born female  Birth Weight: 5 lb 2.9 oz (2350 g) APGAR: 9, 9  Baby Feeding: Breast Disposition:NICU   05/23/2016 Zenaida NieceMEISINGER,Brittny Spangle D, MD

## 2016-09-02 ENCOUNTER — Emergency Department (HOSPITAL_COMMUNITY): Payer: Medicaid Other

## 2016-09-02 ENCOUNTER — Encounter (HOSPITAL_COMMUNITY): Payer: Self-pay | Admitting: *Deleted

## 2016-09-02 ENCOUNTER — Emergency Department (HOSPITAL_COMMUNITY)
Admission: EM | Admit: 2016-09-02 | Discharge: 2016-09-02 | Disposition: A | Discharge status: Home / Self Care | Payer: Medicaid Other | Attending: Emergency Medicine | Admitting: Emergency Medicine

## 2016-09-02 DIAGNOSIS — Z87891 Personal history of nicotine dependence: Secondary | ICD-10-CM | POA: Insufficient documentation

## 2016-09-02 DIAGNOSIS — R102 Pelvic and perineal pain unspecified side: Secondary | ICD-10-CM

## 2016-09-02 DIAGNOSIS — N83201 Unspecified ovarian cyst, right side: Secondary | ICD-10-CM

## 2016-09-02 DIAGNOSIS — Z975 Presence of (intrauterine) contraceptive device: Secondary | ICD-10-CM

## 2016-09-02 LAB — COMPREHENSIVE METABOLIC PANEL
ALBUMIN: 3.9 g/dL (ref 3.5–5.0)
ALK PHOS: 57 U/L (ref 38–126)
ALT: 26 U/L (ref 14–54)
AST: 19 U/L (ref 15–41)
Anion gap: 7 (ref 5–15)
BUN: 7 mg/dL (ref 6–20)
CALCIUM: 9.5 mg/dL (ref 8.9–10.3)
CHLORIDE: 108 mmol/L (ref 101–111)
CO2: 25 mmol/L (ref 22–32)
CREATININE: 0.69 mg/dL (ref 0.44–1.00)
GFR calc non Af Amer: >60 mL/min (ref >60)
GLUCOSE: 90 mg/dL (ref 65–99)
Potassium: 3.6 mmol/L (ref 3.5–5.1)
SODIUM: 140 mmol/L (ref 135–145)
Total Bilirubin: 1.2 mg/dL (ref 0.3–1.2)
Total Protein: 7 g/dL (ref 6.5–8.1)

## 2016-09-02 LAB — WET PREP, GENITAL
CLUE CELLS WET PREP: NONE SEEN
SPERM: NONE SEEN
TRICH WET PREP: NONE SEEN
Yeast Wet Prep HPF POC: NONE SEEN

## 2016-09-02 LAB — URINALYSIS, ROUTINE W REFLEX MICROSCOPIC
BILIRUBIN URINE: NEGATIVE
GLUCOSE, UA: NEGATIVE mg/dL
Ketones, ur: 15 mg/dL — AB
Leukocytes, UA: NEGATIVE
Nitrite: NEGATIVE
Protein, ur: NEGATIVE mg/dL
SPECIFIC GRAVITY, URINE: 1.025 (ref 1.005–1.030)
pH: 6 (ref 5.0–8.0)

## 2016-09-02 LAB — URINE MICROSCOPIC-ADD ON

## 2016-09-02 LAB — CBC
HCT: 40.2 % (ref 36.0–46.0)
HEMOGLOBIN: 13.2 g/dL (ref 12.0–15.0)
MCH: 27.6 pg (ref 26.0–34.0)
MCHC: 32.8 g/dL (ref 30.0–36.0)
MCV: 84.1 fL (ref 78.0–100.0)
PLATELETS: 358 10*3/uL (ref 150–400)
RBC: 4.78 MIL/uL (ref 3.87–5.11)
RDW: 14 % (ref 11.5–15.5)
WBC: 6.8 10*3/uL (ref 4.0–10.5)

## 2016-09-02 LAB — I-STAT BETA HCG BLOOD, ED (MC, WL, AP ONLY): I-stat hCG, quantitative: <5 m[IU]/mL (ref <5)

## 2016-09-02 MED ORDER — NAPROXEN 500 MG PO TABS: 500.0000 mg | ORAL_TABLET | Freq: Two times a day (BID) | ORAL | 0 refills | Status: AC

## 2016-09-02 MED ORDER — IBUPROFEN 400 MG PO TABS
600.0000 mg | ORAL_TABLET | Freq: Once | ORAL | Status: AC
  Administered 2016-09-02: 600 mg via ORAL
  Filled 2016-09-02: qty 1

## 2016-09-02 NOTE — Discharge Instructions (Signed)
Follow up with Dr. Jackelyn KnifeMeisinger to be sure the cyst resolves. If you have additional [facility] problems [facility]

## 2016-09-02 NOTE — ED Triage Notes (Signed)
Pt states morena birth control placed in mid July (normal vag delivery May 30).  Since beginning of august pt has experienced increasing pelvic pain.  Denies vaginal bleeding or increasing discharge.

## 2016-09-02 NOTE — ED Provider Notes (Signed)
[facility]-EMER[facility]ENCY DEPT Provider Note   CSN: [id number] Arrival date & time: 09/02/16  1240     History   Chief Complaint Chief Complaint  Patient presents with  . Pelvic Pain    HPI Erica Salas is a 30 y.o. G2P1102 who presents to the ED with pelvic pain. She reports that 7 weeks post SVD she had an IUD placed. She reports having had so pain since it was inserted but her doctor told her to expect some discomfort but it should resolve after the first couple weeks. The IUD has been in 2 months and continues to cause pain. Today the pain became severe and the patient decided to come in for evaluation. She has taken tylenol for pain without results.   The history is provided by the patient. No language interpreter was used.  Pelvic Pain  This is a new problem. The current episode started more than 1 week ago. The problem occurs constantly. The problem has been gradually worsening. Pertinent negatives include no abdominal pain and no headaches. The symptoms are aggravated by intercourse. Nothing relieves the symptoms. She has tried acetaminophen for the symptoms. The treatment provided no relief.    Past Medical History:  Diagnosis Date  . Infection    UTI  . Vaginal Pap smear, abnormal     Patient Active Problem List   Diagnosis Date Noted  . Preterm premature rupture of membranes in third trimester 05/07/2016  . Risk of preterm labor 05/06/2016  . Traumatic injury during pregnancy, antepartum 04/18/2016  . Traumatic injury during pregnancy in third trimester 04/18/2016  . Short cervical length during pregnancy 02/16/2016    Past Surgical History:  Procedure Laterality Date  . CERVICAL CERCLAGE N/A 02/17/2016   Procedure: CERCLAGE CERVICAL;  Surgeon: Sherian Rein, MD;  Location: [facility];  Service: Gynecology;  Laterality: N/A;  . LEEP      OB History    Gravida Para Term Preterm AB Living   2 2 1 1  0 2   SAB TAB Ectopic Multiple Live Births   0 0 0 0 2        Home Medications    Prior to Admission medications   Medication Sig Start Date End Date Taking? Authorizing Provider  acetaminophen (TYLENOL) 500 MG tablet Take 500 mg by mouth every 6 (six) hours as needed for mild pain or headache.    Historical Provider, MD  ibuprofen (ADVIL,MOTRIN) 600 MG tablet Take 1 tablet (600 mg total) by mouth every 6 (six) hours. 05/23/16   Lavina Hamman, MD  naproxen (NAPROSYN) 500 MG tablet Take 1 tablet (500 mg total) by mouth 2 (two) times daily. 09/02/16   Hope Orlene Och, NP  Prenatal Vit-Fe Fumarate-FA (PRENATAL MULTIVITAMIN) TABS tablet Take 1 tablet by mouth daily at 12 noon.    Historical Provider, MD    Family History Family History  Problem Relation Age of Onset  . Diabetes Mother   . Heart disease Mother     hx of MI  . Kidney disease Father   . Cancer Maternal Aunt     one died w/colon/liver; one died from lung  . Diabetes Maternal Aunt     Social History Social History  Substance Use Topics  . Smoking status: Former Smoker    Types: Cigarettes    Quit date: 09/23/2015  . Smokeless tobacco: Never Used     Comment: quit Oct 2016  . Alcohol use No     Allergies   Strawberry (diagnostic)  Review of Systems Review of Systems  Constitutional: Negative for chills and fever.  HENT: Negative.   Eyes: Negative for visual disturbance.  Respiratory: Negative for cough.   Gastrointestinal: Negative for abdominal pain, nausea and vomiting.  Genitourinary: Positive for pelvic pain and vaginal discharge.  Musculoskeletal: Negative for back pain and neck pain.  Skin: Negative for rash.  Neurological: Negative for syncope and headaches.  Psychiatric/Behavioral: Negative for confusion. The patient is not nervous/anxious.      Physical Exam Updated Vital Signs BP 162/93 (BP Location: Left Arm)   Pulse 63   Temp 98.1 F (36.7 C) (Oral)   Resp 17   Ht 5\' 4"  (1.626 m)   Wt 74.8 kg   LMP 08/11/2016   SpO2 100%   Breastfeeding? No    BMI 28.32 kg/m   Physical Exam  Constitutional: She is oriented to person, place, and time. She appears well-developed and well-nourished. No distress.  HENT:  Head: Normocephalic and atraumatic.  Eyes: EOM are normal.  Neck: Normal range of motion. Neck supple.  Cardiovascular: Normal rate and regular rhythm.   Pulmonary/Chest: Effort normal and breath sounds normal.  Abdominal: Soft. Bowel sounds are normal. There is tenderness.  Tender with palpation lower abdomen, no guarding or rebound.   Genitourinary:  Genitourinary Comments: External genitalia without lesions, yellow d/c vaginal vault, IUD string visualized in cervix, mild CMT and bilateral adnexal tenderness, uterus without palpable enlargement.  Musculoskeletal: Normal range of motion.  Neurological: She is alert and oriented to person, place, and time. No cranial nerve deficit.  Skin: Skin is warm and dry.  Psychiatric: She has a normal mood and affect. Her behavior is normal.  Nursing note and vitals reviewed.    ED Treatments / Results  Labs (all labs ordered are listed, but only abnormal results are displayed) Labs Reviewed  WET PREP, GENITAL - Abnormal; Notable for the following:       Result Value   WBC, Wet Prep HPF POC MANY (*)    All other components within normal limits  URINALYSIS, ROUTINE W REFLEX MICROSCOPIC (NOT AT [facility]) - Abnormal; Notable for the following:    Hgb urine dipstick SMALL (*)    Ketones, ur 15 (*)    All other components within normal limits  URINE MICROSCOPIC-ADD ON - Abnormal; Notable for the following:    Squamous Epithelial / LPF 6-30 (*)    Bacteria, UA FEW (*)    All other components within normal limits  COMPREHENSIVE METABOLIC PANEL  CBC  I-STAT BETA HCG BLOOD, ED ([facility], [facility], [facility] ONLY)  GC/CHLAMYDIA PROBE AMP ([place]) NOT AT [facility]   Radiology [place] Transvaginal Non-ob  Result Date: 09/02/2016 CLINICAL DATA:  Pelvic pain.  IUD. EXAM: TRANSABDOMINAL AND TRANSVAGINAL ULTRASOUND  OF PELVIS TECHNIQUE: Both transabdominal and transvaginal ultrasound examinations of the pelvis were performed. Transabdominal technique was performed for global imaging of the pelvis including uterus, ovaries, adnexal regions, and pelvic cul-de-sac. It was necessary to proceed with endovaginal exam following the transabdominal exam to visualize the endometrium and ovaries. COMPARISON:  Fetal ultrasound 05/02/2016 FINDINGS: Uterus Measurements: 8 x 4 x 5 cm. No fibroids or other mass visualized. Endometrium Thickness: 6 mm.  IUD present and in expected position. Right ovary Measurements: 49 x 29 x 44 mm. 26 x 23 x 26 mm intra-ovarian crenulated peripherally hypervascular structure with internal mid level echoes consistent with corpus luteum. Left ovary Measurements: 36 x 26 x 21 mm. Normal appearance/no adnexal mass. Other findings No  abnormal free fluid. IMPRESSION: 1. Hemorrhagic corpus luteum on the right. 2. Small simple pelvic fluid. 3. IUD in good position. Electronically Signed   By: Marnee Spring M.D.   On: 09/02/2016 16:00   US Pelvis Complete  Result Date: 09/02/2016 CLINICAL DATA:  Pelvic pain.  IUD. EXAM: TRANSABDOMINAL AND TRANSVAGINAL ULTRASOUND OF PELVIS TECHNIQUE: Both transabdominal and transvaginal ultrasound examinations of the pelvis were performed. Transabdominal technique was performed for global imaging of the pelvis including uterus, ovaries, adnexal regions, and pelvic cul-de-sac. It was necessary to proceed with endovaginal exam following the transabdominal exam to visualize the endometrium and ovaries. COMPARISON:  Fetal ultrasound 05/02/2016 FINDINGS: Uterus Measurements: 8 x 4 x 5 cm. No fibroids or other mass visualized. Endometrium Thickness: 6 mm.  IUD present and in expected position. Right ovary Measurements: 49 x 29 x 44 mm. 26 x 23 x 26 mm intra-ovarian crenulated peripherally hypervascular structure with internal mid level echoes consistent with corpus luteum. Left ovary  Measurements: 36 x 26 x 21 mm. Normal appearance/no adnexal mass. Other findings No abnormal free fluid. IMPRESSION: 1. Hemorrhagic corpus luteum on the right. 2. Small simple pelvic fluid. 3. IUD in good position. Electronically Signed   By: Marnee Spring M.D.   On: 09/02/2016 16:00    Procedures Procedures (including critical care time)  Medications Ordered in ED Medications  ibuprofen (ADVIL,MOTRIN) tablet 600 mg (600 mg Oral Given 09/02/16 1630)     Initial Impression / Assessment and Plan / ED Course  I have reviewed the triage vital signs and the nursing notes.  Pertinent labs & imaging results that were available during my care of the patient were reviewed by me and considered in my medical decision making (see chart for details).  Clinical Course    Final Clinical Impressions(s) / ED Diagnoses  30 y.o. female with pelvic pain stable for d/c without fever or acute abdomen. Discussed with the patient clinical and ultrasound findings and plan of care. She will f/u with her GYN as scheduled later in the week and discuss the hemorrhagic ovarian cyst. Will treat for pain and inflammation. Discussed IUD is in correct place and how to check string. Patient voices understanding and agrees with plan.   Final diagnoses:  Pelvic pain in female  IUD (intrauterine device) in place  Cyst of right ovary    New Prescriptions Discharge Medication List as of 09/02/2016  5:21 PM    START taking these medications   Details  naproxen (NAPROSYN) 500 MG tablet Take 1 tablet (500 mg total) by mouth 2 (two) times daily., Starting Mon 09/02/2016, Print         [place], NP 09/03/16 1709    Marily Memos, MD 09/03/16 [phone]

## 2016-09-03 LAB — GC/CHLAMYDIA PROBE AMP (~~LOC~~) NOT AT ARMC
CHLAMYDIA, DNA PROBE: NEGATIVE
NEISSERIA GONORRHEA: NEGATIVE

## 2017-02-05 IMAGING — US US MFM OB FOLLOW-UP
1 series · 14 of 28 positions shown · non-contrast
Comparison: none

[Series 1: us mfm ob follow-up · 43 acquisitions, 14 frames shown]
[im 2/43]
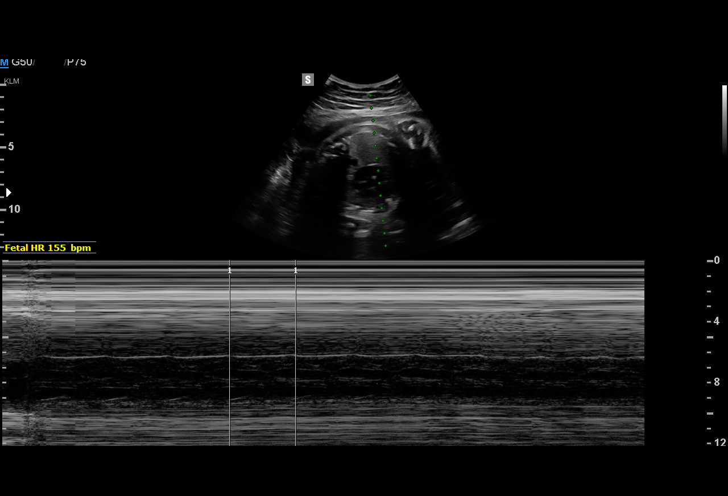
[im 5/43]
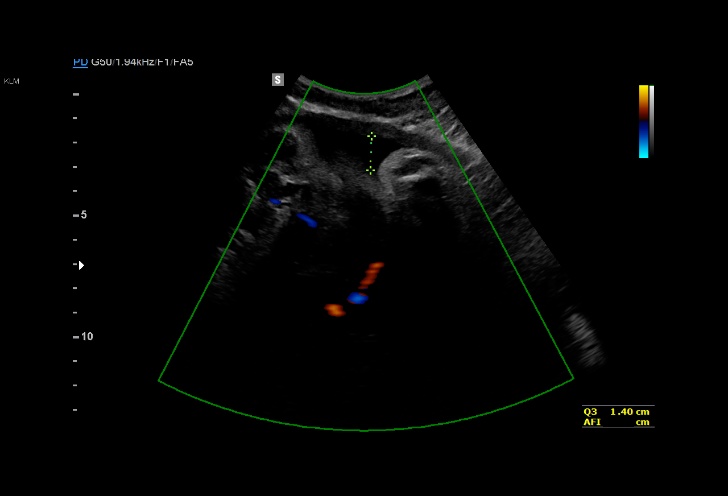
[im 8/43]
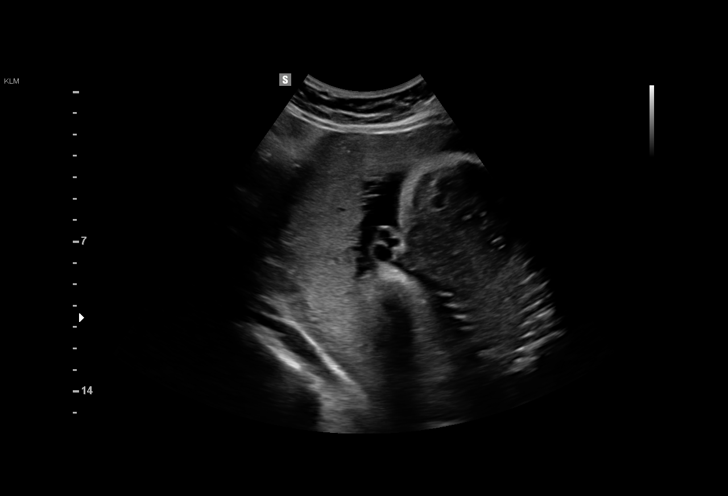
[im 11/43]
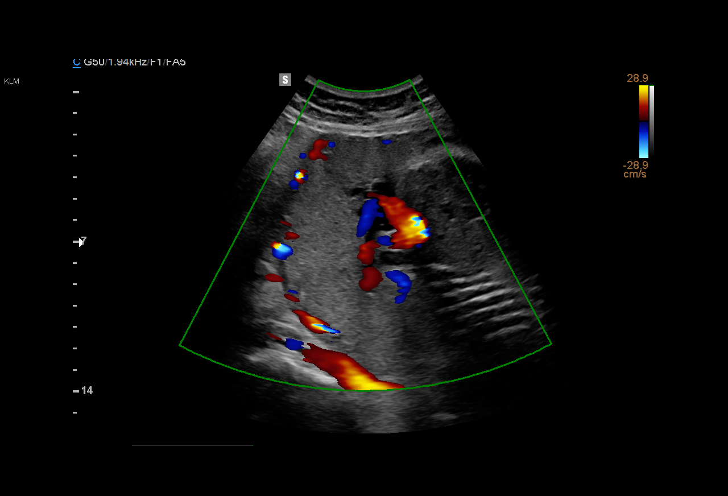
[im 15/43]
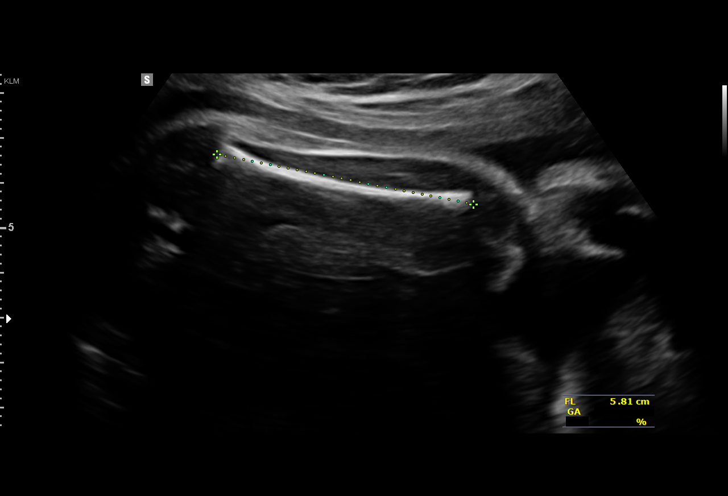
[im 18/43]
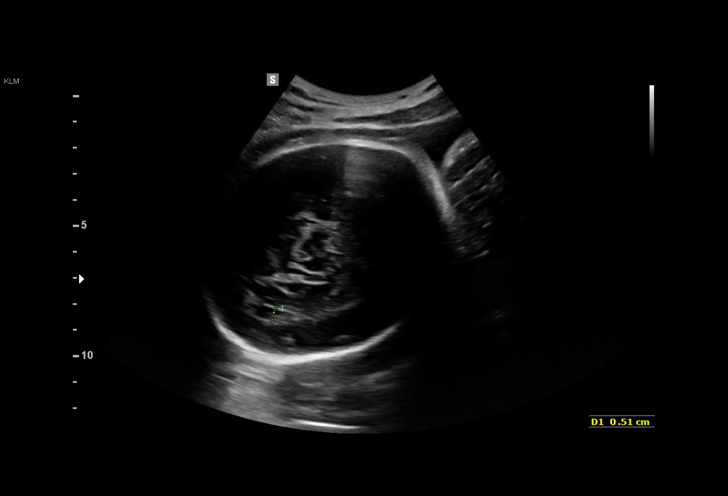
[im 21/43]
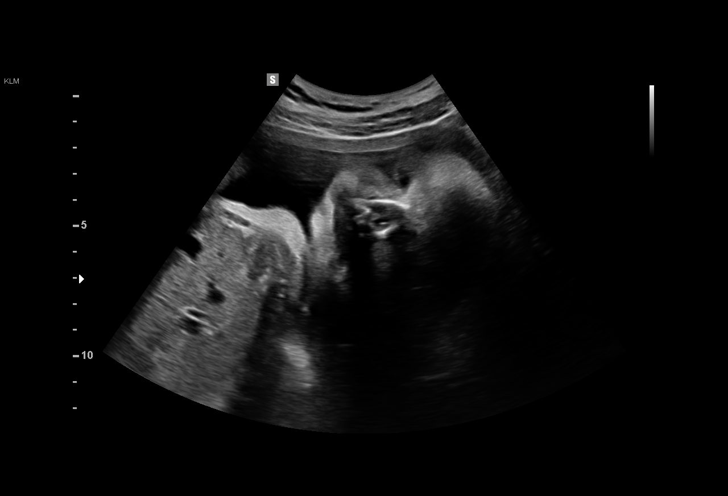
[im 24/43]
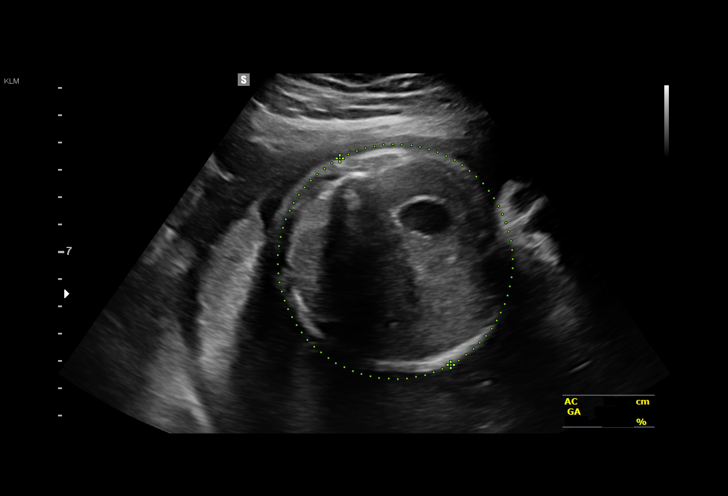
[im 27/43]
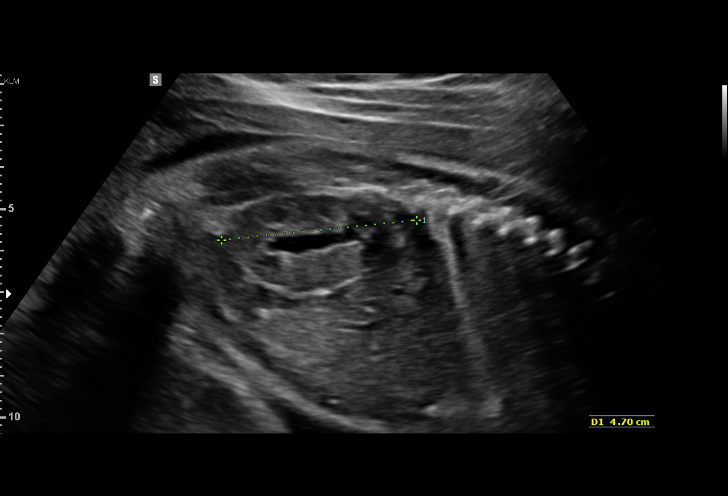
[im 30/43]
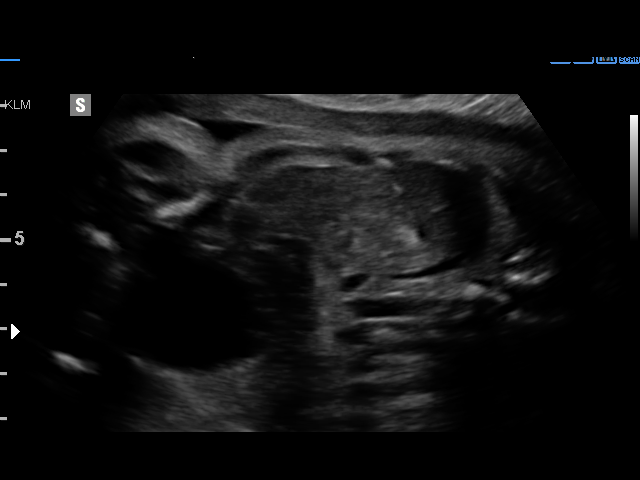
[im 33/43]
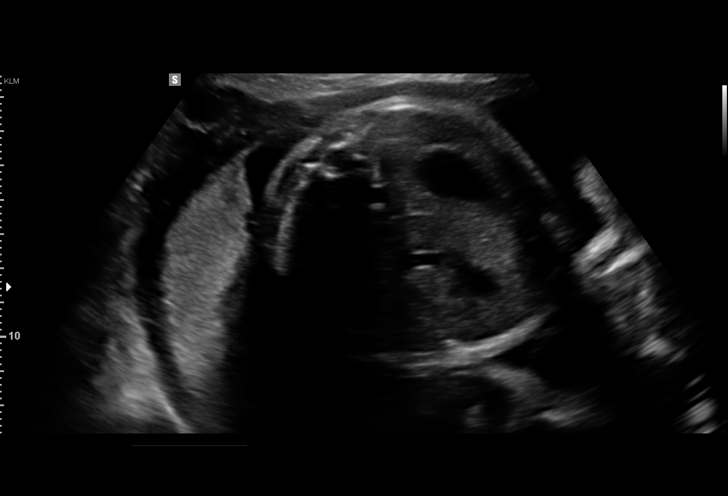
[im 36/43]
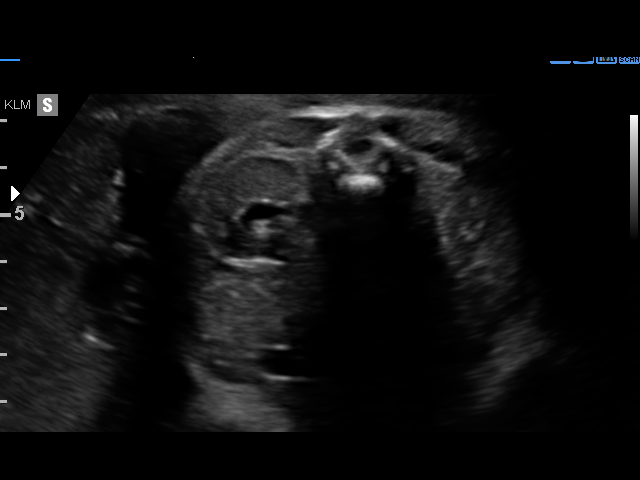
[im 39/43]
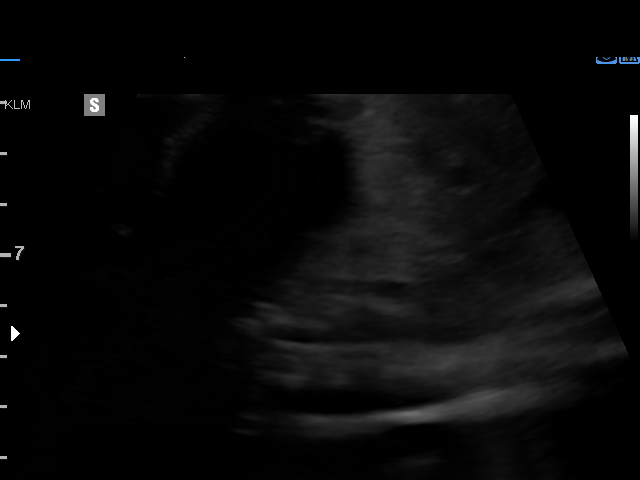
[im 43/43]
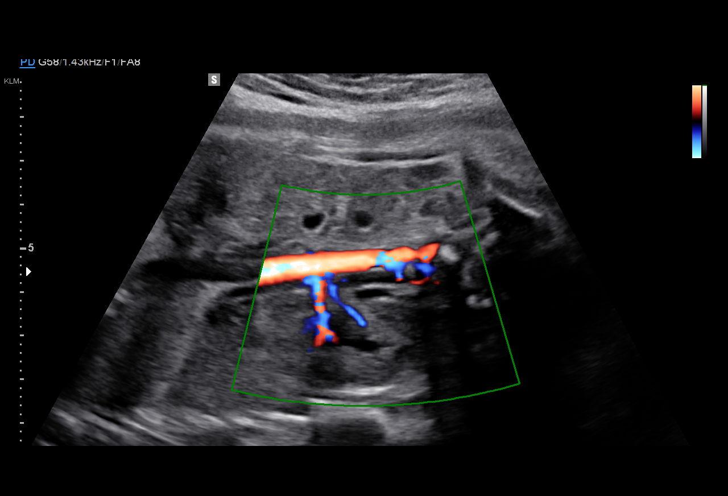

[14 of 28 positions shown; findings below may reference images not displayed]

#[id number]

1  MEHRI MARTES            [id number]      [id number]     [id number]
Indications

31 weeks gestation of pregnancy
Abnormal fetal ultrasound; renal anomaly
Unspecified maternal hypertension, third
trimester
Cervical cerclage suture present, third
trimester
OB History

Gravidity:    2         Term:   1        Prem:   0         SAB:   0
TOP:          0       Ectopic:  0        Living: 1
Fetal Evaluation

Num Of Fetuses:     1
Fetal Heart         155
Rate(bpm):
Cardiac Activity:   Observed
Presentation:       Cephalic
Placenta:           Fundal, above cervical os
P. Cord Insertion:  Visualized

Amniotic Fluid
AFI FV:      Subjectively within normal limits

AFI Sum(cm)     %Tile       Largest Pocket(cm)
17.86           66

RUQ(cm)       RLQ(cm)       LUQ(cm)        LLQ(cm)
6.49
Biometry
BPD:      80.5  mm     G. Age:  32w 2d         64  %    CI:         72.38  %    70 - 86
FL/HC:       19.7  %    19.1 -
HC:        301  mm     G. Age:  33w 3d         63  %    HC/AC:       1.10       0.96 -
AC:      272.4  mm     G. Age:  31w 2d         41  %    FL/BPD:      73.5  %    71 - 87
FL:       59.2  mm     G. Age:  30w 6d         20  %    FL/AC:       21.7  %    20 - 24

Est. FW:    5448   gm    3 lb 15 oz     54  %
Gestational Age

LMP:           31w 4d        Date:  09/24/15                 EDD:    06/30/16
U/S Today:     32w 0d                                        EDD:    06/27/16
Best:          31w 4d     Det. By:  LMP  (09/24/15)          EDD:    06/30/16
Anatomy

Cranium:               Appears normal         Aortic Arch:            Previously seen
Cavum:                 Previously seen        Ductal Arch:            Previously seen
Ventricles:            Appears normal         Diaphragm:              Appears normal
Choroid Plexus:        Previously seen        Stomach:                Appears normal, left
sided
Cerebellum:            Previously seen        Abdomen:                Previously seen
Posterior Fossa:       Previously seen        Abdominal Wall:         Previously seen
Nuchal Fold:           Not applicable (>20    Cord Vessels:           Previously seen
wks GA)
Face:                  Orbits and profile     Kidneys:                Abnormal, see
previously seen
comments
Lips:                  Appears normal         Bladder:                Appears normal
Thoracic:              Appears normal         Spine:                  Previously seen
Heart:                 Previously seen        Upper Extremities:      Previously seen
RVOT:                  Previously seen        Lower Extremities:      Previously seen
LVOT:                  Previously seen

Other:  Heels previously visualized. Nasal bone previously  visualized.
Technically difficult due to fetal position.
Cervix Uterus Adnexa

Cervix
Not visualized (advanced GA >49wks)
Impression

SIUP at 31+4 weeks
Normal kidney on right; difficult to locate previously seen
dysplastic kidney on left
All other interval fetal anatomy was seen and appeared
normal; anatomic survey complete
Normal amniotic fluid volume
Appropriate interval growth with EFW at the 54th %tile

The US findings were shared with Ms. Bhebhe.
Recommendations

Follow-up as clinically indicated
Postnatal evaluation of newborn; refer to pediatric urology if
needed at that time

## 2018-04-23 IMAGING — US US TRANSVAGINAL NON-OB
1 series · 14 of 25 positions shown · non-contrast
Comparison: Fetal ultrasound 05/02/2016

CLINICAL DATA: Pelvic pain.  IUD.

EXAM:
TRANSABDOMINAL AND TRANSVAGINAL ULTRASOUND OF PELVIS
TECHNIQUE: Both transabdominal and transvaginal ultrasound examinations of the
pelvis were performed. Transabdominal technique was performed for
global imaging of the pelvis including uterus, ovaries, adnexal
regions, and pelvic cul-de-sac. It was necessary to proceed with
endovaginal exam following the transabdominal exam to visualize the
endometrium and ovaries.

[Series 1: us transvaginal non-ob · 0.21mm/px · 14 of 69 slices shown]
[im 1/69]
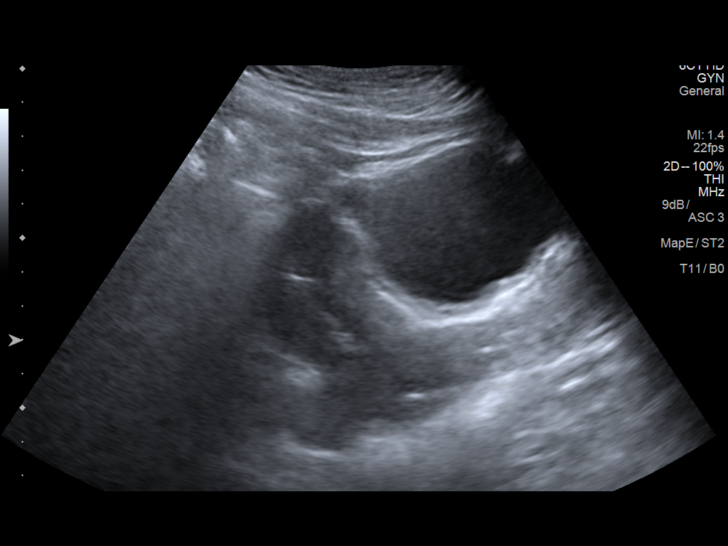
[im 6/69]
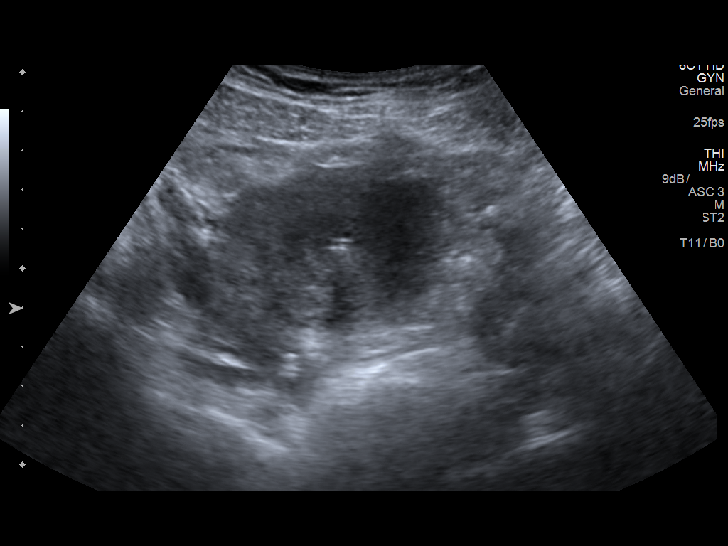
[im 12/69]
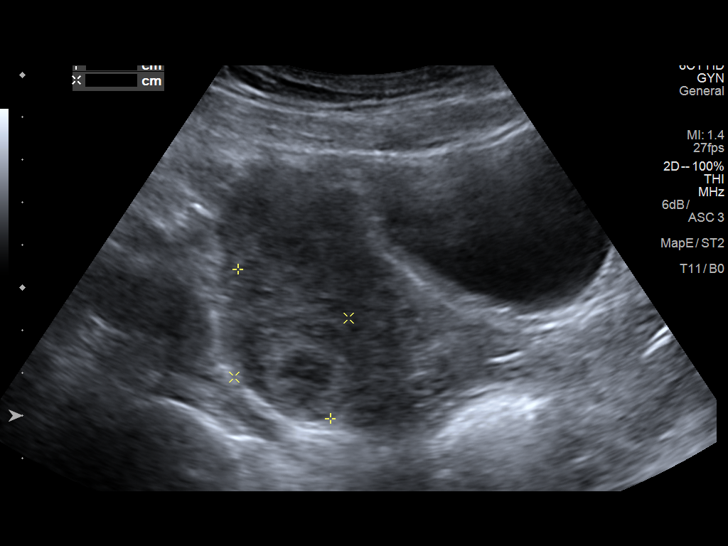
[im 18/69]
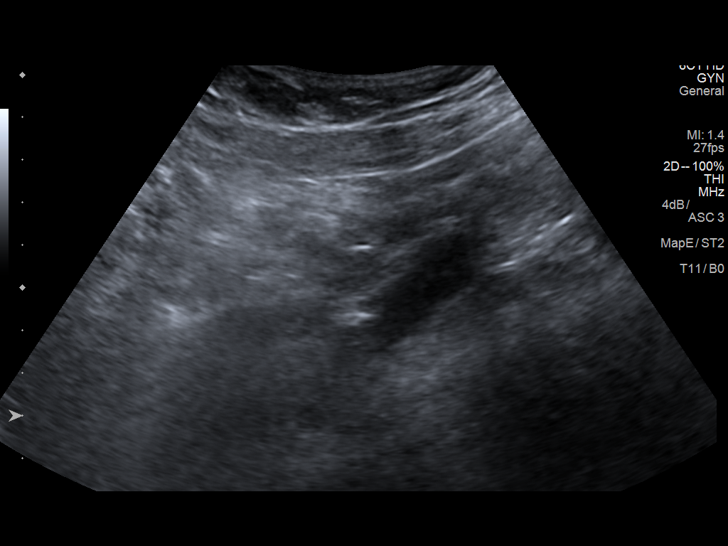
[im 23/69]
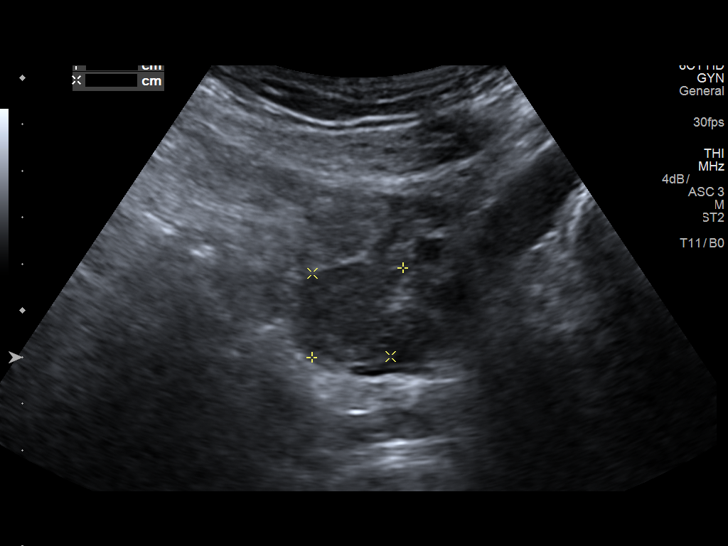
[im 26/69]
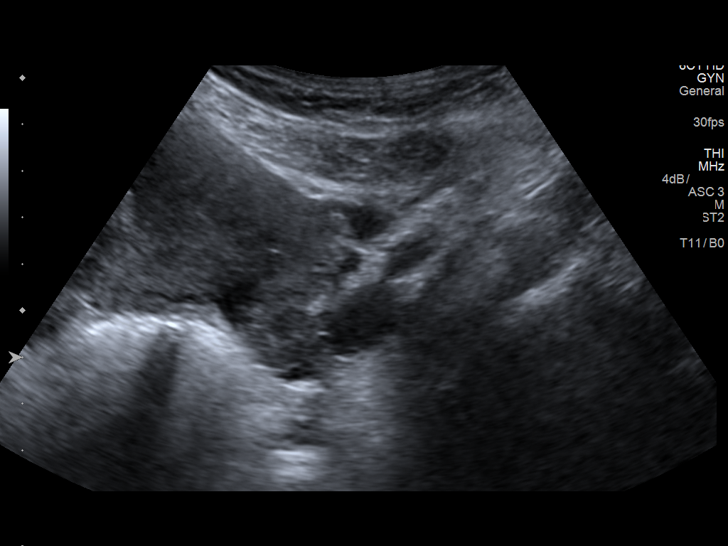
[im 32/69]
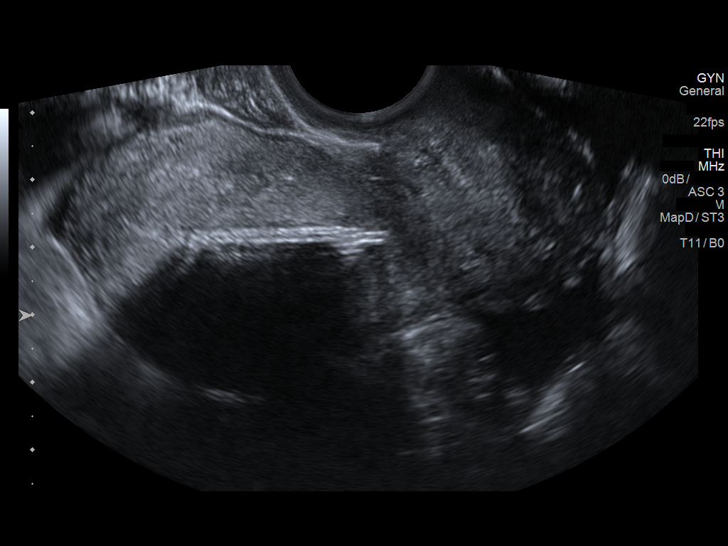
[im 37/69]
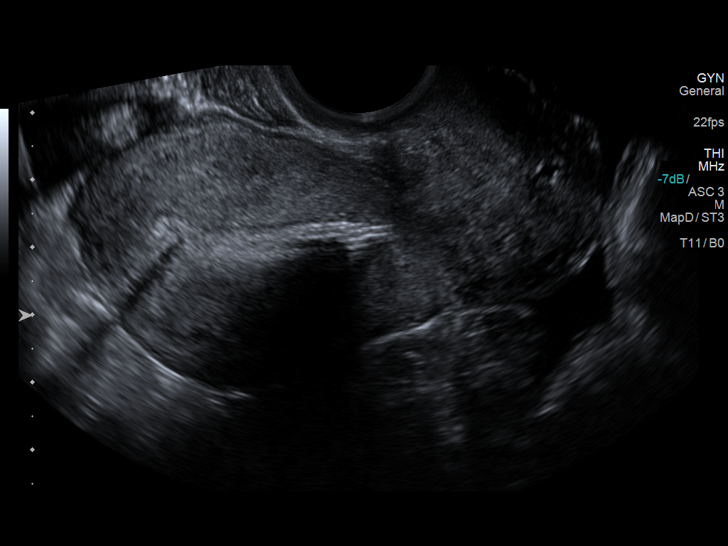
[im 43/69]
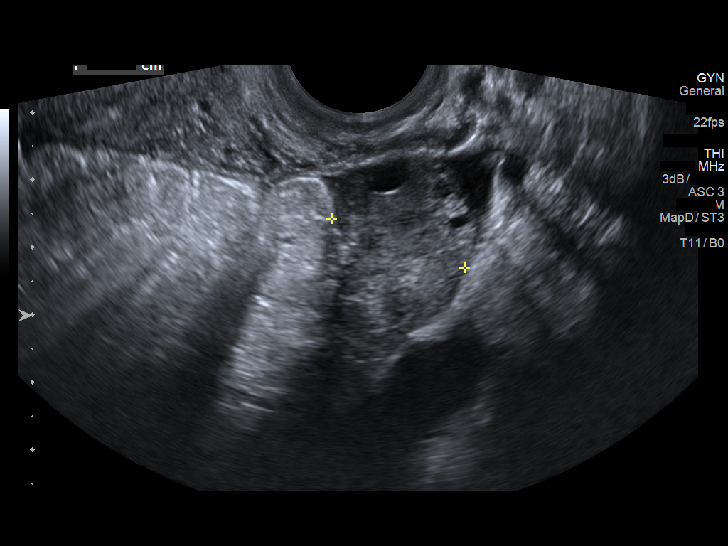
[im 46/69]
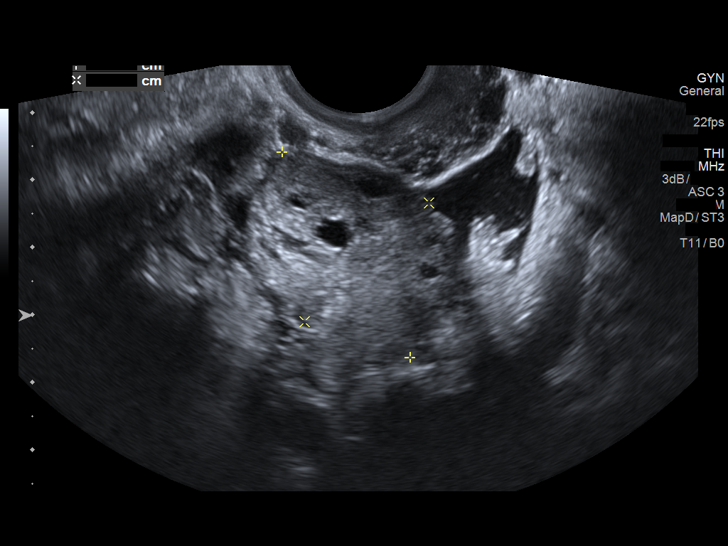
[im 52/69]
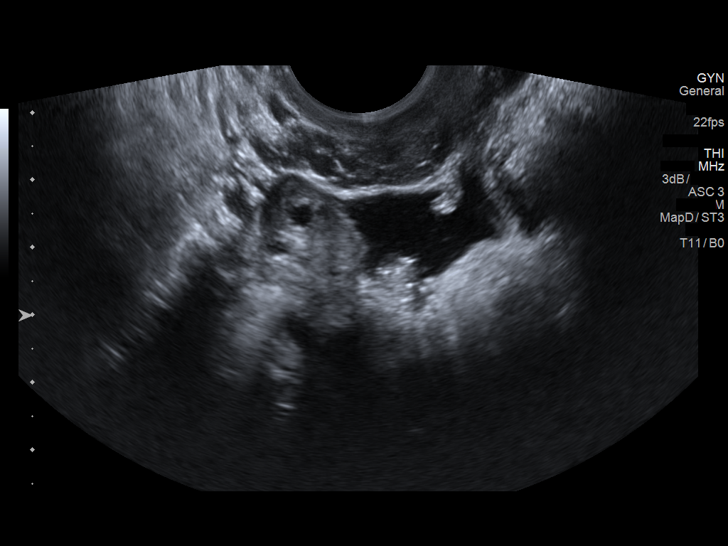
[im 57/69]
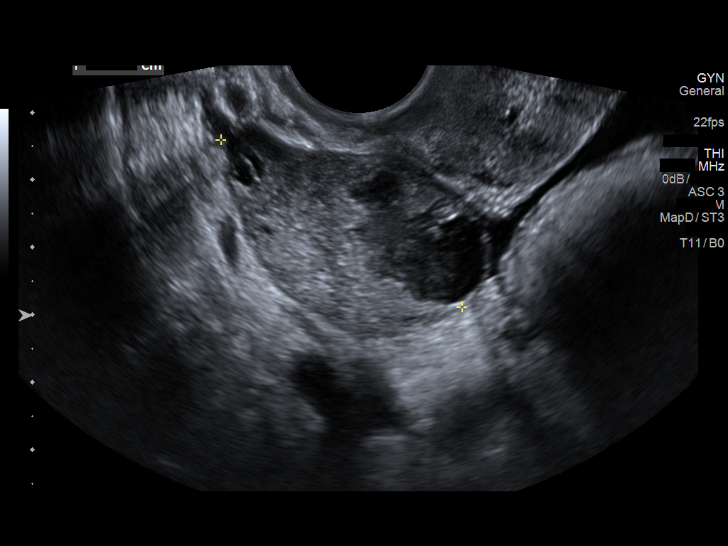
[im 63/69]
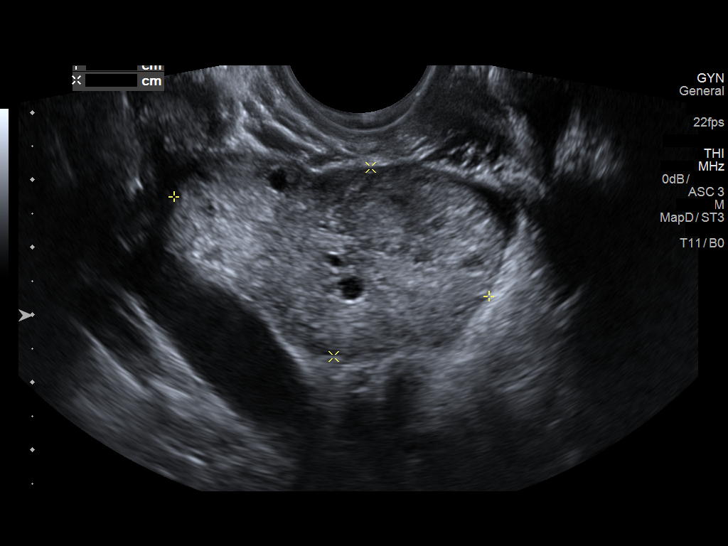
[im 69/69]
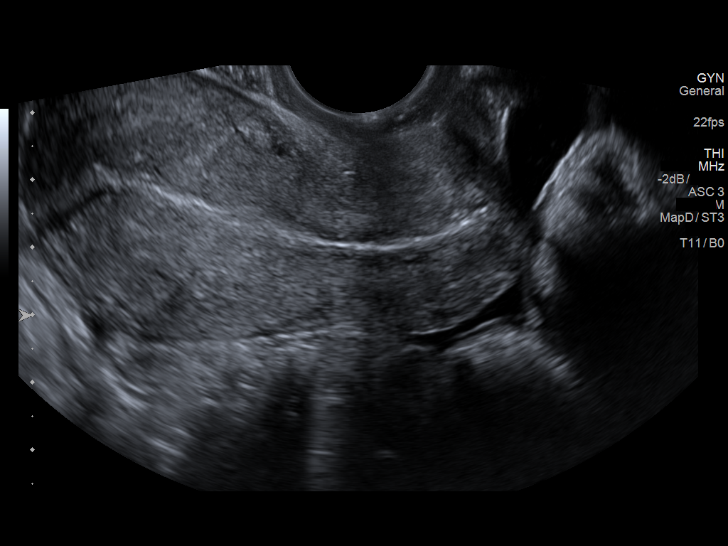

[14 of 25 positions shown; findings below may reference images not displayed]

FINDINGS: Uterus

Measurements: 8 x 4 x 5 cm. No fibroids or other mass visualized.

Endometrium

Thickness: 6 mm.  IUD present and in expected position.

Right ovary

Measurements: 49 x 29 x 44 mm. 26 x 23 x 26 mm intra-ovarian
crenulated peripherally hypervascular structure with internal mid
level echoes consistent with corpus luteum.

Left ovary

Measurements: 36 x 26 x 21 mm. Normal appearance/no adnexal mass.

Other findings

No abnormal free fluid.
IMPRESSION: 1. Hemorrhagic corpus luteum on the right.
2. Small simple pelvic fluid.
3. IUD in good position.
